# Patient Record
Sex: Male | Born: 1935 | Race: White | Hispanic: No | Smoking: Never smoker
Health system: Southern US, Community
[De-identification: ages and names within clinical notes are randomized; demographics above are authoritative.]

## PROBLEM LIST (undated history)

## (undated) DIAGNOSIS — I480 Paroxysmal atrial fibrillation: Secondary | ICD-10-CM

## (undated) DIAGNOSIS — I639 Cerebral infarction, unspecified: Secondary | ICD-10-CM

## (undated) DIAGNOSIS — M549 Dorsalgia, unspecified: Secondary | ICD-10-CM

## (undated) DIAGNOSIS — G8929 Other chronic pain: Secondary | ICD-10-CM

## (undated) HISTORY — DX: Dorsalgia, unspecified: M54.9

## (undated) HISTORY — DX: Cerebral infarction, unspecified: I63.9

## (undated) HISTORY — DX: Paroxysmal atrial fibrillation: I48.0

## (undated) HISTORY — DX: Other chronic pain: G89.29

---

## 2004-12-16 ENCOUNTER — Ambulatory Visit: Payer: Self-pay | Admitting: Orthopedic Surgery

## 2010-05-15 ENCOUNTER — Ambulatory Visit: Payer: Self-pay

## 2011-02-02 ENCOUNTER — Ambulatory Visit: Payer: Self-pay | Admitting: Orthopedic Surgery

## 2011-02-18 ENCOUNTER — Inpatient Hospital Stay: Payer: Self-pay | Admitting: *Deleted

## 2011-03-10 ENCOUNTER — Ambulatory Visit: Payer: Self-pay | Admitting: Orthopedic Surgery

## 2011-03-31 ENCOUNTER — Ambulatory Visit: Payer: Self-pay | Admitting: Pain Medicine

## 2011-04-14 ENCOUNTER — Ambulatory Visit: Payer: Self-pay | Admitting: Pain Medicine

## 2011-04-28 ENCOUNTER — Ambulatory Visit: Payer: Self-pay | Admitting: Pain Medicine

## 2011-05-03 ENCOUNTER — Inpatient Hospital Stay (HOSPITAL_COMMUNITY)
Admission: EM | Admit: 2011-05-03 | Discharge: 2011-05-06 | DRG: 066 | Disposition: A | Discharge status: Skilled Nursing Facility | Payer: Medicare Other | Attending: Internal Medicine | Admitting: Internal Medicine

## 2011-05-03 ENCOUNTER — Emergency Department (HOSPITAL_COMMUNITY): Payer: Medicare Other

## 2011-05-03 DIAGNOSIS — D696 Thrombocytopenia, unspecified: Secondary | ICD-10-CM | POA: Diagnosis present

## 2011-05-03 DIAGNOSIS — R269 Unspecified abnormalities of gait and mobility: Secondary | ICD-10-CM | POA: Diagnosis present

## 2011-05-03 DIAGNOSIS — I4891 Unspecified atrial fibrillation: Secondary | ICD-10-CM | POA: Diagnosis present

## 2011-05-03 DIAGNOSIS — R4182 Altered mental status, unspecified: Secondary | ICD-10-CM | POA: Diagnosis present

## 2011-05-03 DIAGNOSIS — I6350 Cerebral infarction due to unspecified occlusion or stenosis of unspecified cerebral artery: Principal | ICD-10-CM | POA: Diagnosis present

## 2011-05-03 LAB — POCT I-STAT, CHEM 8
Chloride: 107 mEq/L (ref 96–112)
Creatinine, Ser: 1.3 mg/dL (ref 0.50–1.35)
Glucose, Bld: 104 mg/dL — ABNORMAL HIGH (ref 70–99)
HCT: 49 % (ref 39.0–52.0)
Potassium: 4.2 mEq/L (ref 3.5–5.1)

## 2011-05-04 ENCOUNTER — Inpatient Hospital Stay (HOSPITAL_COMMUNITY): Payer: Medicare Other

## 2011-05-04 ENCOUNTER — Other Ambulatory Visit (HOSPITAL_COMMUNITY): Payer: Medicare Other

## 2011-05-04 ENCOUNTER — Encounter (HOSPITAL_COMMUNITY): Payer: Self-pay

## 2011-05-04 DIAGNOSIS — I6350 Cerebral infarction due to unspecified occlusion or stenosis of unspecified cerebral artery: Secondary | ICD-10-CM

## 2011-05-04 LAB — COMPREHENSIVE METABOLIC PANEL
ALT: 21 U/L (ref 0–53)
AST: 24 U/L (ref 0–37)
Albumin: 3.1 g/dL — ABNORMAL LOW (ref 3.5–5.2)
CO2: 24 mEq/L (ref 19–32)
Calcium: 8.7 mg/dL (ref 8.4–10.5)
Sodium: 137 mEq/L (ref 135–145)
Total Protein: 5.8 g/dL — ABNORMAL LOW (ref 6.0–8.3)

## 2011-05-04 LAB — URINALYSIS, ROUTINE W REFLEX MICROSCOPIC
Bilirubin Urine: NEGATIVE
Hgb urine dipstick: NEGATIVE
Nitrite: NEGATIVE
Specific Gravity, Urine: 1.03 (ref 1.005–1.030)
pH: 5 (ref 5.0–8.0)

## 2011-05-04 LAB — CBC
HCT: 45.5 % (ref 39.0–52.0)
MCH: 28.2 pg (ref 26.0–34.0)
MCHC: 32.8 g/dL (ref 30.0–36.0)
MCV: 85.4 fL (ref 78.0–100.0)
Platelets: 134 10*3/uL — ABNORMAL LOW (ref 150–400)
RBC: 4.76 MIL/uL (ref 4.22–5.81)
RDW: 16.4 % — ABNORMAL HIGH (ref 11.5–15.5)
RDW: 16.4 % — ABNORMAL HIGH (ref 11.5–15.5)
WBC: 8.5 10*3/uL (ref 4.0–10.5)

## 2011-05-04 LAB — LIPID PANEL
Cholesterol: 183 mg/dL (ref 0–200)
HDL: 59 mg/dL (ref >39)
Total CHOL/HDL Ratio: 3.1 RATIO
Triglycerides: 69 mg/dL (ref <150)
VLDL: 14 mg/dL (ref 0–40)

## 2011-05-04 LAB — CK TOTAL AND CKMB (NOT AT ARMC): CK, MB: 6.7 ng/mL (ref 0.3–4.0)

## 2011-05-04 LAB — PROTIME-INR
INR: 1.03 (ref 0.00–1.49)
INR: 1.06 (ref 0.00–1.49)
Prothrombin Time: 13.7 seconds (ref 11.6–15.2)

## 2011-05-04 LAB — DIFFERENTIAL
Eosinophils Relative: 1 % (ref 0–5)
Lymphocytes Relative: 22 % (ref 12–46)
Lymphs Abs: 1.9 10*3/uL (ref 0.7–4.0)
Monocytes Absolute: 0.7 10*3/uL (ref 0.1–1.0)

## 2011-05-04 LAB — TROPONIN I: Troponin I: <0.3 ng/mL (ref <0.30)

## 2011-05-05 LAB — CBC
Hemoglobin: 13.6 g/dL (ref 13.0–17.0)
MCH: 28.7 pg (ref 26.0–34.0)
MCV: 86.1 fL (ref 78.0–100.0)
Platelets: 123 10*3/uL — ABNORMAL LOW (ref 150–400)
RBC: 4.74 MIL/uL (ref 4.22–5.81)
WBC: 5.5 10*3/uL (ref 4.0–10.5)

## 2011-05-05 LAB — PROTIME-INR: Prothrombin Time: 14.9 seconds (ref 11.6–15.2)

## 2011-05-06 LAB — PROTIME-INR
INR: 1.26 (ref 0.00–1.49)
Prothrombin Time: 16.1 seconds — ABNORMAL HIGH (ref 11.6–15.2)

## 2011-05-13 NOTE — Discharge Summary (Addendum)
NAMEARISTIDE, Andrews               ACCOUNT NO.:  [id number]  MEDICAL RECORD NO.:  [id number]  LOCATION:  1405                         FACILITY:  [facility]  PHYSICIAN:  Peggye Pitt, M.D. DATE OF BIRTH:  1934/10/24  DATE OF ADMISSION:  05/03/2011 DATE OF DISCHARGE:  05/06/2011                              DISCHARGE SUMMARY   PRIMARY CARE PROVIDER:  Unassigned at this time.  DISCHARGE DIAGNOSES: 1. Subacute cerebrovascular accident, probably secondary to atrial     fibrillation. 2. Atrial fibrillation. 3. Gait disturbance secondary to subacute cerebrovascular accident. 4. Thrombocytopenia. 5. Altered mental status secondary to subacute cerebrovascular     accident.  DISCHARGE MEDICATIONS: 1. Aspirin 81 mg p.o. daily. 2. Cardizem CD 120 mg p.o. daily. 3. Coumadin 5 mg p.o. daily, INR to be monitored daily by the facility     and Coumadin dose adjusted until INR is therapeutic specifically     between 2 and 3. 4. Zocor 20 mg p.o. daily.  DIAGNOSTIC LABORATORY DATA:  Sodium 137, potassium 4.2, chloride 107, CO2 of 22, BUN 25, creatinine 1.3, glucose 104.  WBC is 8.5, hemoglobin 15.2, hematocrit 45.5, platelets 135, EtOH less than 11.  First set of cardiac enzymes, total CK 344, CK-MB 6.7, troponin I less than 0.30.  PT is 13.7, INR is 1.03, PTT 29.  Lipid profile yields cholesterol of 183, triglycerides 69, HDL 59, LDL 110.  Homocystine 13.3, hemoglobin A1c 5.7.  Urinalysis was negative.  DIAGNOSTIC STUDIES: 1. On admission, chest x-ray yields no acute disease. 2. On admission, CT of the head yields findings compatible with     subacute infarct of the right frontoparietal lobe. 3. On admission, MRI without contrast yields moderate sized     acute/subacute infarct involving the right insular region and     posterior right periauricular region extending to the posterior     frontal - parietal lobe junction. 4. MRA of the head done on admission yields intracranial  atherosclerotic type changes most notable involving the right     middle cerebral artery branches. 5. A 2-D echo done on May 04, 2011, yields an ejection fraction of 60-     65%.  Grade 1 diastolic dysfunction.  No diagnostic regional wall     motion abnormality. 6. Carotid Dopplers yield no significant extracranial carotid artery     stenosis.  Vertebrals are patent with antegrade flow.  CONSULTS:  None.  PROCEDURES:  None.  BRIEF HISTORY:  [name] is a very pleasant 77 year old man who presented to the [facility] ED after being pulled over by the police while driving erratically on the highway.  The patient was apparently coming from [place] to [place] when with highway patrolman spotted him driving erratically.  He was stopped and the police thought him to be intoxicated.  He was tested for alcohol which was negative.  On further questioning, police found that he was to have bruises all over his body and his family was called.  He was also found to be limping and having an abnormal gait, hence he was brought to the emergency room.  In the emergency room, the patient reported having knowledge that he has a history  of an irregular heartbeat and he was at one time on medication to control his heart rate but has not been on that medication for quite a while.  He denied knowing that he had had a stroke.  He denied palpitations, headache, dizziness, fever, cough.  In the ED, he was however found to have subacute stroke and also limping towards the left side.  Hospitalist were asked to admit for further evaluation and treatment.  HOSPITAL COURSE BY PROBLEM: 1. Subacute cerebrovascular accident, probably secondary to untreated     atrial fibrillation.  A 2-D echo with 60-65% grade 1 diastolic     dysfunction.  Carotids were negative for stenosis.  The patient was     evaluated by PT and OT during his hospitalization who recommended     assistance and SNF as the patient  will need 24 x7 care for     cognitive issues..  The patient was started on Zocor right before     discharge as well as aspirin. 2. Atrial fibrillation, currently in sinus rhythm.  At the time of     admission, the patient was started on Cardizem for rate control.     Initially he was given 60 mg q.6 which was decreased to 30 mg q.6     due to bradycardia.  This was transitioned to Cardizem CD 120     daily.  The patient will need followup with his primary care     provider and/or cardiologist in [place] when he leaves the     facility.  The patient also started on Coumadin that was dosed by     pharmacy.  He is currently on 5 mg daily.  This will need to be     monitored on a daily basis until his INR is therapeutic between 2     and 3 and the pharmacy at the facility will need to adjust his     dosage until he is at goal. 3. Gait disturbance secondary to subacute cerebrovascular accident.     The patient participated in physical therapy during his     hospitalization.  Will need to continue daily PT. 4. Altered mental status secondary to subacute cerebrovascular     accident.  The patient demonstrated short-term memory issues as     well as difficulties with focus and problem-solving.  Last evening,     he demonstrated some increased confusion probably second to     sundowner's.  At that morning of discharge, the patient was back at     his baseline.  Will need to continue with rehab at facility. 5. Thrombocytopenia.  Will need outpatient follow-up.  ACTIVITY:  As tolerated per Physical Therapy at facility.  Currently he needs minimal assist with ambulation.  Fall precautions.  DIET:  Regular diet.  FOLLOWUP:  The patient at current time does not have a primary care provider in [place] where he resides.  While he is at facility, that physician to monitor.  At time of discharge from rehab will need assistance establishing primary care provider as well as  cardiology followup.  PHYSICAL EXAMINATION:  VITAL SIGNS:  Temperature 98.0, blood pressure 120/70, heart rate 52, respirations 20, sats 96% on room air. GENERAL:  Awake, alert, no acute distress. CV:  Regular rate and rhythm.  No murmur, gallop or rub.  No lower extremity edema. RESPIRATORY:  Normal effort.  Breath sounds clear to auscultation bilaterally.  No rhonchi, wheezes or rales. NEURO:  Alert and oriented  x2.  Speech clear.  Gait remains unsteady. Short-term memory loss. ABDOMEN:  Round, soft, positive bowel sounds throughout, nontender to palpation.  DISPOSITION:  The patient is medically stable and ready for discharge to facility.  TIME SPENT:  45 minutes.     Gwenyth Bender, NP   ______________________________ Peggye Pitt, M.D.    KMB/MEDQ  D:  05/06/2011  T:  05/06/2011  Job:  [id number]  Electronically Signed by Peggye Pitt M.D. on 05/13/2011 07:52:49 PM Electronically Signed by Toya Smothers  on 05/16/2011 11:00:13 AM

## 2011-05-17 NOTE — H&P (Signed)
[name], [name]               ACCOUNT NO.:  [id number]  MEDICAL RECORD NO.:  [id number]  LOCATION:  WLED                         FACILITY:  [facility]  PHYSICIAN:  Lonia Blood, M.D.      DATE OF BIRTH:  Mar 13, 1934  DATE OF ADMISSION:  05/03/2011 DATE OF DISCHARGE:                             HISTORY & PHYSICAL   PRIMARY CARE PHYSICIAN:  He is unassigned to [place].  PRESENTING COMPLAINT:  Confusion and abnormal gait balance.  HISTORY OF PRESENT ILLNESS:  The patient is a 77 year old gentleman who was brought in by the police after being found driving erratically on the road.  He was apparently coming from [place] to [place] area when the highway patrolman spotted him driving erratically on the road. He was stopped by the police thought to be drunk.  He was tested for alcohol and was negative.  On further questioning by the police, the patient was found to have bruises all over his body and his family were called.  He was found to be limping and having abnormal gait, hence he was brought to the emergency room.  In the ED, the patient reported having knowing that he had irregular heart rate for long time and he was at one time on medication to control the heart rate but has not been on it for a while now.  He also denied having knowing that he had a stroke. No palpitations.  Denied any fever, cough, shortness of breath or chest pain.  He was however found to have subacute stroke among other things and also limping towards the left side.  PAST MEDICAL HISTORY:  His past medical history is significant mainly for chronic back pain and atrial fibrillation which seems to be paroxysmal and recent fall.  ALLERGIES:  No known drug allergies.  MEDICATIONS:  The patient denied any medicines at this point.  SOCIAL HISTORY:  He lives alone in [place].  Denied any tobacco abuse.  No IV drug use.  Occasional alcohol but socially.  FAMILY HISTORY:  Denied any significant family history  including stroke. Mainly hypertension that runs in the family.  REVIEW OF SYMPTOMS:  All systems reviewed are negative except per HPI. The patient had a fall about a week ago in his shower but he was fully conscious.  PHYSICAL EXAMINATION:  VITAL SIGNS:  On his exam, temperature is 98.4, blood pressure initially 153/71 and currently 110/76, his pulse was 135, respiratory rate 20, sats 96% on room air. GENERAL:  The patient is awake, alert, oriented, very pleasant man. Slightly confused but now fully oriented to person, place and time and he has good recall. HEENT:  PERRL.  EOMI.  No significant pallor, no jaundice.  No rhinorrhea. NECK:  Supple.  No visible JVD, no lymphadenopathy. RESPIRATORY:  He has good air entry bilaterally.  No significant wheezes or rales or crackles. CARDIOVASCULAR SYSTEM:  He has irregularly irregular rhythm. ABDOMEN:  His abdomen is soft, full, nontender with positive bowel sounds. EXTREMITIES:  No significant edema, cyanosis or clubbing. SKIN EXAM:  He has multiple bruises and rashes on his left knee, right lower extremity, left upper extremity and right upper extremity respectively, all this from his  recent falls. NEURO EXAM:  Cranial nerves II through XII seem to be fully intact. Power is 5/5 in upper and lower extremities respectively.  Reflexes 2+ bilaterally.  The patient has slightly poor coordination on the left. He walks with a limping gait towards the right side with the left side slightly draggy behind; otherwise no other significant findings.  LABORATORY DATA:  Sodium is 137, potassium 4.2, chloride is 107, BUN 25, creatinine 1.30, glucose 104.  White count is 8.5, hemoglobin 15.2, platelet count 135 with normal differentials.  Alcohol level less than 11.  Initial cardiac enzymes are negative.  Chest x-ray showed no acute disease.  Head CT without contrast showed findings compatible with subacute infarct in the right frontoparietal lobe.  His  EKG showed atrial fibrillation with rapid ventricular response.  Followup EKG showed atrial fibrillation but current rate has been controlled with Cardizem.  ASSESSMENT:  This is a 77 year old gentleman presenting with subacute stroke and gait abnormalities.  The patient seems to have paroxysmal atrial fibrillation.  He is not on any medication at this point for that.  PLAN: 1. Gait disturbance.  We will admit the patient; get PT, OT to     evaluate him.  He lives alone and this is a high risk for him     especially the way he drives on the highway.  After full     evaluation, we will determine whether or not the patient can stay     at home or needs to go to facility or stay with family.  In the     meantime, we will address his underlying cause which is probably     the subacute stroke. 2. Subacute cerebrovascular accident.  We will work him up, get     carotid Dopplers; MRI, MRA of the brain and also 2-D     echocardiogram.  More than likely this came from his atrial     fibrillation.  I will start him also aspirin but also at this point     the patient needs to be on some anticoagulation.  We will check     fasting lipid panel as well and then consider starting on statin. 3. Atrial fibrillation.  Again per patient he knew he had history of     that but apparently has not been on any treatment.  I will put him     some Cardizem for rate control and also start him on Coumadin but     no Lovenox or any bridging due to the subacute stroke.  We will get     cardiology consult either locally or make referred to cardiologist     at [facility] for further treatment. 4. Status post fall.  It might be related to his current stroke.  In     maybe when he had a stroke, although the patient denied it.  We     will get the PT, OT to evaluate him fully and make recommendations. 5. Thrombocytopenia.  The patient denied heavy alcohol intake and I     will take him by his words.  The cause of  thrombocytopenia is not     entirely known at this point.  We will follow it closely and if     this persists, will do further workup.     Lonia Blood, M.D.     Verlin Grills  D:  05/04/2011  T:  05/04/2011  Job:  [id number]  Electronically Signed by Lonia Blood M.D. on  05/17/2011 12:34:50 AM

## 2012-07-21 ENCOUNTER — Emergency Department: Payer: Self-pay | Admitting: Emergency Medicine

## 2012-07-21 LAB — CBC
MCH: 28.5 pg (ref 26.0–34.0)
MCHC: 32.9 g/dL (ref 32.0–36.0)
MCV: 87 fL (ref 80–100)
Platelet: 155 10*3/uL (ref 150–440)
RDW: 15.7 % — ABNORMAL HIGH (ref 11.5–14.5)

## 2012-07-21 LAB — COMPREHENSIVE METABOLIC PANEL
Albumin: 3.8 g/dL (ref 3.4–5.0)
Alkaline Phosphatase: 73 U/L (ref 50–136)
Bilirubin,Total: 0.5 mg/dL (ref 0.2–1.0)
Co2: 22 mmol/L (ref 21–32)
Creatinine: 1.16 mg/dL (ref 0.60–1.30)
Glucose: 138 mg/dL — ABNORMAL HIGH (ref 65–99)
Osmolality: 278 (ref 275–301)
Sodium: 138 mmol/L (ref 136–145)
Total Protein: 7.1 g/dL (ref 6.4–8.2)

## 2012-07-21 LAB — TROPONIN I: Troponin-I: <0.02 ng/mL

## 2012-08-13 IMAGING — CR DG CHEST 1V PORT
1 series · 1 of 1 positions shown · non-contrast
Comparison: none

REASON FOR EXAM: Chest Pain
COMMENTS:

PROCEDURE:     DXR - DXR PORTABLE CHEST SINGLE VIEW  - February 17, 2011  [DATE]
RESULT:     Comparison: None.

[view not recorded]
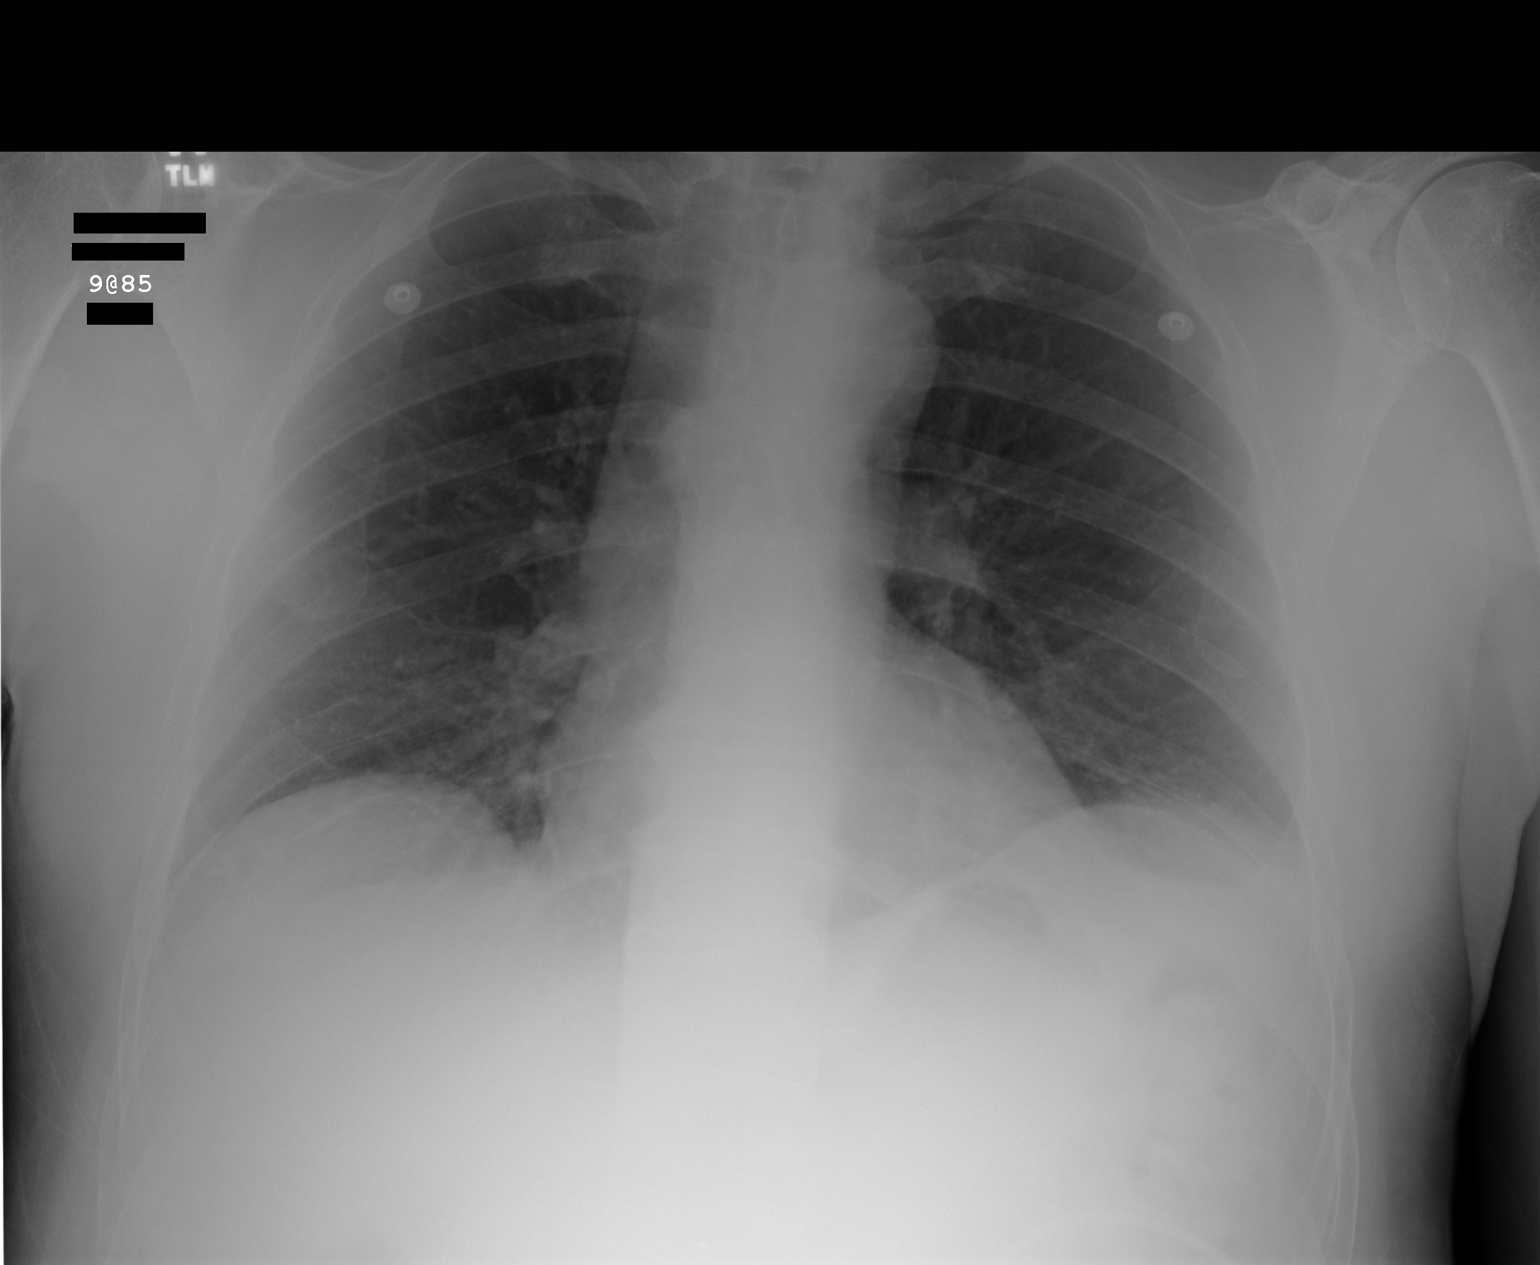

[1 of 1 positions shown; findings below may reference images not displayed]

FINDINGS: Heart is normal in size. Mild prominence of the right the mediastinum is
felt to be secondary to patient rotation. There are mild left basilar
opacities that likely represent atelectasis.
IMPRESSION: Mild left basilar opacities likely represent atelectasis. Early infection
not excluded.

Followup PA and lateral chest radiographs are recommended.

## 2012-08-14 ENCOUNTER — Emergency Department: Payer: Self-pay | Admitting: Emergency Medicine

## 2012-08-14 LAB — BASIC METABOLIC PANEL
Anion Gap: 11 (ref 7–16)
BUN: 23 mg/dL — ABNORMAL HIGH (ref 7–18)
Chloride: 104 mmol/L (ref 98–107)
Creatinine: 1.26 mg/dL (ref 0.60–1.30)
Sodium: 139 mmol/L (ref 136–145)

## 2012-08-14 LAB — CBC
HCT: 39.6 % — ABNORMAL LOW (ref 40.0–52.0)
HGB: 13.5 g/dL (ref 13.0–18.0)
MCH: 28.9 pg (ref 26.0–34.0)
MCV: 85 fL (ref 80–100)
Platelet: 231 10*3/uL (ref 150–440)
RBC: 4.66 10*6/uL (ref 4.40–5.90)
WBC: 9.4 10*3/uL (ref 3.8–10.6)

## 2012-08-14 LAB — URINALYSIS, COMPLETE
Glucose,UR: 50 mg/dL (ref 0–75)
Leukocyte Esterase: NEGATIVE
Nitrite: POSITIVE
Protein: 100
RBC,UR: 9289 /HPF (ref 0–5)
Squamous Epithelial: NONE SEEN
WBC UR: 56 /HPF (ref 0–5)

## 2012-09-01 ENCOUNTER — Ambulatory Visit: Payer: Self-pay | Admitting: Urology

## 2013-05-08 ENCOUNTER — Observation Stay: Payer: Self-pay | Admitting: Internal Medicine

## 2013-05-08 LAB — CBC
HCT: 47.5 % (ref 40.0–52.0)
HGB: 15.8 g/dL (ref 13.0–18.0)
MCH: 27.3 pg (ref 26.0–34.0)
RBC: 5.77 10*6/uL (ref 4.40–5.90)
RDW: 16.6 % — ABNORMAL HIGH (ref 11.5–14.5)

## 2013-05-08 LAB — COMPREHENSIVE METABOLIC PANEL
Albumin: 3.7 g/dL (ref 3.4–5.0)
Alkaline Phosphatase: 74 U/L (ref 50–136)
Anion Gap: 10 (ref 7–16)
Co2: 24 mmol/L (ref 21–32)
Creatinine: 1.24 mg/dL (ref 0.60–1.30)
Glucose: 138 mg/dL — ABNORMAL HIGH (ref 65–99)
SGPT (ALT): 32 U/L (ref 12–78)
Total Protein: 7.1 g/dL (ref 6.4–8.2)

## 2013-05-08 LAB — PROTIME-INR: INR: 0.9

## 2013-05-08 LAB — CK TOTAL AND CKMB (NOT AT ARMC): CK-MB: 5.8 ng/mL — ABNORMAL HIGH (ref 0.5–3.6)

## 2013-05-09 LAB — CBC WITH DIFFERENTIAL/PLATELET
Eosinophil #: 0.2 10*3/uL (ref 0.0–0.7)
Eosinophil %: 3.9 %
MCH: 27.5 pg (ref 26.0–34.0)
MCV: 82 fL (ref 80–100)
Monocyte #: 0.5 x10 3/mm (ref 0.2–1.0)
Neutrophil #: 2.8 10*3/uL (ref 1.4–6.5)
RBC: 5.01 10*6/uL (ref 4.40–5.90)
RDW: 16.8 % — ABNORMAL HIGH (ref 11.5–14.5)

## 2013-05-09 LAB — BASIC METABOLIC PANEL
Anion Gap: 7 (ref 7–16)
BUN: 13 mg/dL (ref 7–18)
Chloride: 108 mmol/L — ABNORMAL HIGH (ref 98–107)
EGFR (African American): >60
EGFR (Non-African Amer.): >60
Osmolality: 283 (ref 275–301)

## 2013-05-09 LAB — T4, FREE: Free Thyroxine: 1.09 ng/dL (ref 0.76–1.46)

## 2013-05-10 LAB — LIPID PANEL
HDL Cholesterol: 39 mg/dL — ABNORMAL LOW (ref 40–60)
Triglycerides: 152 mg/dL (ref 0–200)
VLDL Cholesterol, Calc: 30 mg/dL (ref 5–40)

## 2014-05-31 ENCOUNTER — Encounter: Payer: Self-pay | Admitting: *Deleted

## 2015-03-08 NOTE — H&P (Signed)
PATIENT NAME:  Phineas DouglasHUGHES, Kermitt A MR#:  [id number] DATE OF BIRTH:  August 22, 1934  PRIMARY CARE PHYSICIAN:  The patient does not remember.    REFERRING PHYSICIAN: Dr. Mayford KnifeWilliams   CHIEF COMPLAINT: Palpitation and irregular heart today.   HISTORY OF PRESENT ILLNESS: The patient is a 81 year old Caucasian male with a history of paroxysmal atrial fibrillation, cerebrovascular accident one year ago. He presented in the ED with palpitation and irregular heart rate today. The patient denies any chest pain, orthopnea or nocturnal dyspnea. No leg edema. No cough, sputum, shortness of breath or hemoptysis. The patient's heart rate was to 138. EKG showed atrial fibrillation. The  patient is not on any home medications.   PAST MEDICAL HISTORY:  Paroxysmal atrial fibrillation, CVA.   SURGICAL HISTORY:  Arm surgery for broken bone.   FAMILY HISTORY: No hypertension, diabetes, heart attack or stroke.   SOCIAL HISTORY: No smoking or drinking or illicit drugs.   ALLERGIES: None.   MEDICATIONS:  None.   REVIEW OF SYSTEMS:  CONSTITUTIONAL: The patient denies any fever or chills. No headache or dizziness. No weakness.  EYES: No double vision, blurry vision.  ENT: No postnasal drip, slurred speech or dysphagia.  CARDIOVASCULAR: No chest pain, but has palpitations. No orthopnea or nocturnal dyspnea. No leg edema.  PULMONARY: No cough, sputum, shortness of breath or hemoptysis.  GASTROINTESTINAL: No abdominal pain, nausea, vomiting or diarrhea.  GENITOURINARY: No dysuria, hematuria or incontinence.   SKIN: No rash or jaundice.  HEMATOLOGY: No easy bruising or bleeding.  ENDOCRINOLOGY: No polyuria, polydipsia, heat or cold intolerance.  NEUROLOGY: No syncope, loss of consciousness or seizure.   PHYSICAL EXAMINATION: VITAL SIGNS: Temperature 97.5, blood pressure 113/67, pulse 114, respirations 20, oxygen saturation 96% on room air.  GENERAL: The patient is alert, awake, oriented, in no acute distress.  HEENT:  Pupils round, equal and reactive to light and accommodation. Moist oral mucosa. Clear oropharynx.  NECK: Supple. No JVD or carotid bruits are noted. No thyromegaly.  CARDIOVASCULAR: S1, S3 irregular rate, rhythm. No murmurs, gallops.  PULMONARY: Bilateral air entry. No wheezing or rales. No use of accessory muscles to breathe.  ABDOMEN: Soft. No distention or tenderness. No organomegaly. Bowel sounds present.  EXTREMITIES: No edema, clubbing or cyanosis. No calf tenderness. Strong bilateral pedal pulses.  SKIN: No rash or jaundice.  NEUROLOGIC: A and O  x 3. No focal deficit. Power 5/5. Sensation intact.   LABORATORY DATA:  Chest x-ray: No acute disease of the chest.   Troponin less than 0.02. CK 257, CK-MB 5.8, INR 0.9. CBC normal. Glucose 138, BUN 16, creatinine 1.24.   Electrolytes were normal.   EKG showed atrial fibrillation with a rapid ventricular response at 126 BPM.   IMPRESSIONS: 1.  Atrial fibrillation with rapid ventricular response.  2.  History of cerebrovascular accident.   PLAN OF TREATMENT:   1.  The patient will be placed for observation. We will continue telemonitor, we will start Cardizem 30 mg every 6 hours and start Lovenox  every 12 hours subcutaneous 1 mg/kg.  2.  We will get an echocardiogram and cardiology consult from Dr. Lady GaryFath, who is the patient's cardiologist. Discussed the patient's current condition and plan of treatment with the patient.   CODE STATUS:  The patient is a full code.   TIME SPENT: About 50 minutes    ____________________________ Shaune PollackQing Maris Bena, MD qc:cc D: 05/08/2013 22:10:41 ET T: 05/08/2013 22:50:20 ET JOB#: [id number]  cc: Shaune PollackQing Diarra Kos, MD, <Dictator> Shaune PollackQING Glorianna Gott MD ELECTRONICALLY  SIGNED 05/09/2013 15:26

## 2015-03-08 NOTE — Consult Note (Signed)
Chief Complaint:  Subjective/Chief Complaint States to feel well but appearsto still be confused. Denies cp nofocal weakness.   VITAL SIGNS/ANCILLARY NOTES: **Vital Signs.:   25-Jun-14 11:03  Vital Signs Type Q 4hr  Temperature Temperature (F) 97.5  Celsius 36.3  Temperature Source oral  Pulse Pulse 63  Respirations Respirations 18  Systolic BP Systolic BP 628  Diastolic BP (mmHg) Diastolic BP (mmHg) 81  Mean BP 98  Pulse Ox % Pulse Ox % 96  Pulse Ox Activity Level  At rest  Oxygen Delivery Room Air/ 21 %  *Intake and Output.:   25-Jun-14 12:15  Grand Totals Intake:  240 Output:      Net:  240 24 Hr.:  240  Oral Intake      In:  240  Percentage of Meal Eaten  100   Brief Assessment:  GEN well developed, well nourished, no acute distress   Cardiac Regular  murmur present  -- LE edema  -- JVD  --Gallop   Respiratory normal resp effort  clear BS  no use of accessory muscles   Gastrointestinal Normal   Gastrointestinal details normal Soft  Nontender  Nondistended  No masses palpable   EXTR negative cyanosis/clubbing, negative edema   Lab Results: Thyroid:  24-Jun-14 05:02   Thyroxine, Free 1.09 (Result(s) reported on 09 May 2013 at 11:13AM.)  Thyroid Stimulating Hormone  4.96 (0.45-4.50 (International Unit)  ----------------------- Pregnant patients have  different reference  ranges for TSH:  - - - - - - - - - -  Pregnant, first trimetser:  0.36 - 2.50 uIU/mL)  LabObservation:  24-Jun-14 13:45   OBSERVATION Reason for Test  Cardiology:  24-Jun-14 13:45   Echo Doppler REASON FOR EXAM:     COMMENTS:     PROCEDURE: [facility] - ECHO DOPPLER COMPLETE(TRANSTHOR)  - May 09 2013  1:45PM   RESULT: Echocardiogram Report  Patient Name:   [name] Date of Exam: 05/09/2013 Medical Rec #:  [id number]             Custom1: Date of Birth:  [birth year]          Height:       73.0 in Patient Age:    81 years           Weight:       246.0 lb Patient Gender: M                   BSA:          2.35 m??  Indications: Atrial Fib Sonographer:    Sherrie Sport RDCS Referring Phys: Lynett Fish, B  Summary:  1. Left ventricular ejection fraction, by visual estimation, is 60 to  65%.  2. Normal global left ventricular systolic function.  3. Mild thickening of the anterior and posterior mitral valve leaflets.  4. Mild aortic valve sclerosis without stenosis.  5. Mildly increased left ventricular posterior wall thickness.  6. Mild tricuspid regurgitation. 2D AND M-MODE MEASUREMENTS (normal ranges within parentheses): Left Ventricle:          Normal IVSd (2D):     1.24 cm (0.7-1.1) LVPWd (2D):     1.21 cm (0.7-1.1) Aorta/LA:                  Normal LVIDd (2D):     4.39 cm (3.4-5.7) Aortic Root (2D): 3.10 cm (2.4-3.7) LVIDs (2D):     2.82 cm  Left Atrium (2D): 3.10 cm (1.9-4.0) LV FS (2D):35.8 %   (>25%) LV EF (2D):     65.5 %   (>50%)                                   Right Ventricle:                                   RVd (2D):        6.73 cm LV DIASTOLIC FUNCTION: MV Peak E: 0.78 m/s E/e' Ratio: 7.60 MV Peak A: 0.61 m/s Decel Time: 246 msec E/A Ratio: 1.27 SPECTRAL DOPPLER ANALYSIS (where applicable): Mitral Valve: MV P1/2 Time: 71.34 msec MV Area, PHT: 3.08 cm?? Aortic Valve: AoV Max Vel: 1.00 m/s AoV Peak PG: 4.0 mmHg AoV Mean PG: LVOT Vmax: 0.81 m/s LVOT VTI:  LVOT Diameter: 2.20 cm AoV Area, Vmax: 3.08 cm?? AoV Area, VTI:  AoV Area, Vmn: Tricuspid Valve and PA/RV Systolic Pressure: TR Max Velocity: 1.45 m/s RA  Pressure: 5 mmHg RVSP/PASP: 13.4 mmHg Pulmonic Valve: PV Max Velocity: 0.84 m/s PV Max PG: 2.8 mmHg PV Mean PG:  PHYSICIAN INTERPRETATION: Left Ventricle: The left ventricular internal cavity size was normal. LV  septal wall thickness was normal. LV posterior wall thickness was mildly  increased. Global LV systolic function was normal. Left ventricular  ejection fraction, by visual estimation, is 60 to 65%. Right Ventricle: The  right ventricular size is normal. Left Atrium: The left atrium is normal in size. Right Atrium: The right atrium is normal in size. Pericardium: There is no evidence of pericardial effusion. Mitral Valve: The mitral valve is normal in structure. There is mild  thickening of the anterior and posterior mitral valve leaflets. Trace  mitral valve regurgitation is seen. Tricuspid Valve: The tricuspid valve is normal. Mild tricuspid  regurgitation is visualized. The tricuspid regurgitant velocity is 1.45  m/s, and with an assumed right atrial pressure of 5 mmHg, the estimated  right ventricular systolic pressure is normal at 13.4 mmHg. Aortic Valve: The aortic valve is normal. Mild aortic valve sclerosis is  present, with no evidence of aortic valve stenosis. Pulmonic Valve: The pulmonic valve is normal.  Lisle MD Electronically signed by [place] MD Signature Date/Time: 05/09/2013/5:19:00 PM *** Final ***  IMPRESSION: .    Verified By: Yolonda Kida, M.D., MD  Routine Chem:  24-Jun-14 05:02   Cholesterol, Serum 174  Triglycerides, Serum 152  HDL (INHOUSE)  39  VLDL Cholesterol Calculated 30  LDL Cholesterol Calculated  105 (Result(s) reported on 10 May 2013 at 08:16AM.)  Glucose, Serum 98  BUN 13  Creatinine (comp) 1.13  Sodium, Serum 142  Potassium, Serum 4.2  Chloride, Serum  108  CO2, Serum 27  Calcium (Total), Serum  8.3  Anion Gap 7  Osmolality (calc) 283  eGFR (African American) >60  eGFR (Non-African American) >60 (eGFR values <13m/min/1.73 m2 may be an indication of chronic kidney disease (CKD). Calculated eGFR is useful in patients with stable renal function. The eGFR calculation will not be reliable in acutely ill patients when serum creatinine is changing rapidly. It is not useful in  patients on dialysis. The eGFR calculation may not be applicable to patients at the low and high extremes of body sizes, pregnant women, and  vegetarians.)  Magnesium, Serum 1.8 (1.8-2.4 THERAPEUTIC  RANGE: 4-7 mg/dL TOXIC: > 10 mg/dL  -----------------------)  Routine Hem:  24-Jun-14 05:02   WBC (CBC) 5.7  RBC (CBC) 5.01  Hemoglobin (CBC) 13.8  Hematocrit (CBC) 41.1  Platelet Count (CBC)  142  MCV 82  MCH 27.5  MCHC 33.5  RDW  16.8  Neutrophil % 48.4  Lymphocyte % 38.3  Monocyte % 8.6  Eosinophil % 3.9  Basophil % 0.8  Neutrophil # 2.8  Lymphocyte # 2.2  Monocyte # 0.5  Eosinophil # 0.2  Basophil # 0.0 (Result(s) reported on 09 May 2013 at 05:26AM.)   Radiology Results: XRay:    23-Jun-14 20:07, Chest Portable Single View  Chest Portable Single View   REASON FOR EXAM:    Chest Pain  COMMENTS:       PROCEDURE: DXR - DXR PORTABLE CHEST SINGLE VIEW  - May 08 2013  8:07PM     RESULT: Comparison: None    Findings:     Single portable AP chest radiograph is provided.  There is no focal   parenchymal opacity, pleural effusion, or pneumothorax. Normal   cardiomediastinal silhouette. The osseous structures are unremarkable.    IMPRESSION:   No acute disease of the chest.    Dictation Site: 1        Verified By: Jennette Banker, M.D., MD  MRI:    25-Jun-14 10:13, MRI Brain Without Contrast  MRI Brain Without Contrast   REASON FOR EXAM:    cva  COMMENTS:       PROCEDURE: MR  - MR BRAIN WO CONTRAST  - May 10 2013 10:13AM     RESULT: History: CVA    Technique: Multiplanar, multisequence MRI of the brain was obtained   without IV contrast.     Comparison:  None    Findings:     There is no acute infarct. 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 There is no hemorrhage. There is no pathologic   extra-axial fluid collection. There is generalized cerebral atrophy.   There is periventricular and deep white matter T2 and FLAIR   hyperintensity likely secondary to microangiopathy. There is no   hydrocephalus. The ventricles are normal for age. The basal cisterns are    patent. There are no abnormal extra-axial fluid collections.     The visualized paranasal sinuses and mastoid sinuses are clear. The skull   base and calvarium demonstrate normal signal. The major intracranial flow   voids, including dural venous sinuses appear patent.    IMPRESSION:     No acute intracranial pathology.  Dictation Site: 1        Verified By: Jennette Banker, M.D., MD  Cardiology:    23-Jun-14 19:36, ECG  Ventricular Rate 126  Atrial Rate 159  QRS Duration 82  QT 298  QTc 431  R Axis 22  T Axis 54  ECG interpretation   Atrial fibrillation with rapid ventricular response  Nonspecific ST abnormality , probably digitalis effect  Abnormal ECG  When compared with ECG of 21-Jul-2012 20:10,  No significant change was found  ----------unconfirmed----------  Confirmed by OVERREAD, NOT ([id number]), editor PEARSON, BARBARA ([id number]) on 05/10/2013 8:18:52 AM  ECG     23-Jun-14 20:41, ECG  Ventricular Rate 92  Atrial Rate 113  QRS Duration 90  QT 350  QTc 432  R Axis 11  T Axis 51  ECG interpretation   Atrial fibrillation  Abnormal ECG  When compared with ECG  of 08-May-2013 19:36,  No significant change was found  ----------unconfirmed----------  Confirmed by OVERREAD, NOT ([id number]), editor PEARSON, BARBARA ([id number]) on 05/10/2013 8:21:11 AM  ECG     24-Jun-14 13:45, Echo Doppler  Echo Doppler   REASON FOR EXAM:      COMMENTS:       PROCEDURE: [facility] - ECHO DOPPLER COMPLETE(TRANSTHOR)  - May 09 2013  1:45PM     RESULT: Echocardiogram Report    Patient Name:   [name] Date of Exam: 05/09/2013  Medical Rec #:  [id number]             Custom1:  Date of Birth:  10-29-1934          Height:       73.0 in  Patient Age:    9 years           Weight:       246.0 lb  Patient Gender: M                  BSA:          2.35 m??    Indications: Atrial Fib  Sonographer:    Sherrie Sport RDCS  Referring Phys: Lynett Fish, B    Summary:   1. Left ventricular ejection fraction, by  visual estimation, is 60 to   65%.   2. Normal global left ventricular systolic function.   3. Mild thickening of the anterior and posterior mitral valve leaflets.   4. Mild aortic valve sclerosis without stenosis.   5. Mildly increased left ventricular posterior wall thickness.   6. Mild tricuspid regurgitation.  2D AND M-MODE MEASUREMENTS (normal ranges within parentheses):  Left Ventricle:          Normal  IVSd (2D):     1.24 cm (0.7-1.1)  LVPWd (2D):     1.21 cm (0.7-1.1) Aorta/LA:                  Normal  LVIDd (2D):     4.39 cm (3.4-5.7) Aortic Root (2D): 3.10 cm (2.4-3.7)  LVIDs (2D):     2.82 cm           Left Atrium (2D): 3.10 cm (1.9-4.0)  LV FS (2D):35.8 %   (>25%)  LV EF (2D):     65.5 %   (>50%)                                    Right Ventricle:                                    RVd (2D):        6.27 cm  LV DIASTOLIC FUNCTION:  MV Peak E: 0.78 m/s E/e' Ratio: 7.60  MV Peak A: 0.61 m/s Decel Time: 246 msec  E/A Ratio: 1.27  SPECTRAL DOPPLER ANALYSIS (where applicable):  Mitral Valve:  MV P1/2 Time: 71.34 msec  MV Area, PHT: 3.08 cm??  Aortic Valve: AoV Max Vel: 1.00 m/s AoV Peak PG: 4.0 mmHg AoV Mean PG:  LVOT Vmax: 0.81 m/s LVOT VTI:  LVOT Diameter: 2.20 cm  AoV Area, Vmax: 3.08 cm?? AoV Area, VTI:  AoV Area, Vmn:  Tricuspid Valve and PA/RV Systolic Pressure: TR Max Velocity: 1.45 m/s RA   Pressure: 5 mmHg RVSP/PASP: 13.4 mmHg  Pulmonic  Valve:  PV Max Velocity: 0.84 m/s PV Max PG: 2.8 mmHg PV Mean PG:    PHYSICIAN INTERPRETATION:  Left Ventricle: The left ventricular internal cavity size was normal. LV   septal wall thickness was normal. LV posterior wall thickness was mildly   increased. Global LV systolic function was normal. Left ventricular   ejection fraction, by visual estimation, is 60 to 65%.  Right Ventricle: The right ventricular size is normal.  Left Atrium: The left atrium is normal in size.  Right Atrium: The right atrium is normal in  size.  Pericardium: There is no evidence of pericardial effusion.  Mitral Valve: The mitral valve is normal in structure. There is mild   thickening of the anterior and posterior mitral valve leaflets. Trace   mitral valve regurgitation is seen.  Tricuspid Valve: The tricuspid valve is normal. Mild tricuspid   regurgitation is visualized. The tricuspid regurgitant velocity is 1.45   m/s, and with an assumed right atrial pressure of 5 mmHg, the estimated   right ventricular systolic pressure is normal at 13.4 mmHg.  Aortic Valve: The aortic valve is normal. Mild aortic valve sclerosis is   present, with no evidence of aortic valve stenosis.  Pulmonic Valve: The pulmonic valve is normal.    Henderson MD  Electronically signed by [place] MD  Signature Date/Time: 05/09/2013/5:19:00 PM  *** Final ***    IMPRESSION: .        Verified By: Yolonda Kida, M.D., MD   Assessment/Plan:  Invasive Device Daily Assessment of Necessity:  Does the patient currently have any of the following indwelling devices? none   Assessment/Plan:  Assessment IMP AFIB HxCVA Dementia Confusion HTN Hyperliidemia Non compliance .   Plan PLAN ASA 81 mg daily Tele  Rec xarelto 20 mg daily Rate controlwith diltiazemdaily Consider rhythm control with Amiodarone Statin therapy for lipids Care management consult Psychiatry involment for management input   Electronic Signatures: Yolonda Kida (MD)  (Signed 25-Jun-14 21:10)  Authored: Chief Complaint, VITAL SIGNS/ANCILLARY NOTES, Brief Assessment, Lab Results, Radiology Results, Assessment/Plan   Last Updated: 25-Jun-14 21:10 by Lujean Amel D (MD)

## 2015-03-08 NOTE — Consult Note (Signed)
Chief Complaint:  Subjective/Chief Complaint He states to be doing well and wants to go home. He still appears a little confused.   VITAL SIGNS/ANCILLARY NOTES: **Vital Signs.:   26-Jun-14 07:12  Vital Signs Type Routine  Temperature Temperature (F) 98.5  Celsius 36.9  Temperature Source oral  Pulse Pulse 54  Respirations Respirations 18  Systolic BP Systolic BP 96  Diastolic BP (mmHg) Diastolic BP (mmHg) 49  Mean BP 64  Pulse Ox % Pulse Ox % 96  Pulse Ox Activity Level  At rest  Oxygen Delivery Room Air/ 21 %  *Intake and Output.:   26-Jun-14 08:41  Grand Totals Intake:  240 Output:      Net:  240 24 Hr.:  240  Oral Intake      In:  240  Percentage of Meal Eaten  100   Brief Assessment:  GEN well developed, well nourished, no acute distress   Cardiac Regular  murmur present  -- LE edema  -- JVD  --Gallop   Respiratory normal resp effort  clear BS  no use of accessory muscles   Gastrointestinal Normal   Gastrointestinal details normal Soft  Nontender  Nondistended  No masses palpable   EXTR negative cyanosis/clubbing, negative edema   Lab Results: Thyroid:  24-Jun-14 05:02   Thyroxine, Free 1.09 (Result(s) reported on 09 May 2013 at 11:13AM.)  Thyroid Stimulating Hormone  4.96 (0.45-4.50 (International Unit)  ----------------------- Pregnant patients have  different reference  ranges for TSH:  - - - - - - - - - -  Pregnant, first trimetser:  0.36 - 2.50 uIU/mL)  LabObservation:  24-Jun-14 13:45   OBSERVATION Reason for Test  Cardiology:  24-Jun-14 13:45   Echo Doppler REASON FOR EXAM:     COMMENTS:     PROCEDURE: [facility] - ECHO DOPPLER COMPLETE(TRANSTHOR)  - May 09 2013  1:45PM   RESULT: Echocardiogram Report  Patient Name:   [name] Date of Exam: 05/09/2013 Medical Rec #:  [id number]             Custom1: Date of Birth:  1934-02-17          Height:       73.0 in Patient Age:    81 years           Weight:       246.0 lb Patient Gender: M                   BSA:          2.35 m??  Indications: Atrial Fib Sonographer:    Sherrie Sport RDCS Referring Phys: Lynett Fish, B  Summary:  1. Left ventricular ejection fraction, by visual estimation, is 60 to  65%.  2. Normal global left ventricular systolic function.  3. Mild thickening of the anterior and posterior mitral valve leaflets.  4. Mild aortic valve sclerosis without stenosis.  5. Mildly increased left ventricular posterior wall thickness.  6. Mild tricuspid regurgitation. 2D AND M-MODE MEASUREMENTS (normal ranges within parentheses): Left Ventricle:          Normal IVSd (2D):     1.24 cm (0.7-1.1) LVPWd (2D):     1.21 cm (0.7-1.1) Aorta/LA:                  Normal LVIDd (2D):     4.39 cm (3.4-5.7) Aortic Root (2D): 3.10 cm (2.4-3.7) LVIDs (2D):     2.82 cm  Left Atrium (2D): 3.10 cm (1.9-4.0) LV FS (2D):35.8 %   (>25%) LV EF (2D):     65.5 %   (>50%)                                   Right Ventricle:                                   RVd (2D):        1.61 cm LV DIASTOLIC FUNCTION: MV Peak E: 0.78 m/s E/e' Ratio: 7.60 MV Peak A: 0.61 m/s Decel Time: 246 msec E/A Ratio: 1.27 SPECTRAL DOPPLER ANALYSIS (where applicable): Mitral Valve: MV P1/2 Time: 71.34 msec MV Area, PHT: 3.08 cm?? Aortic Valve: AoV Max Vel: 1.00 m/s AoV Peak PG: 4.0 mmHg AoV Mean PG: LVOT Vmax: 0.81 m/s LVOT VTI:  LVOT Diameter: 2.20 cm AoV Area, Vmax: 3.08 cm?? AoV Area, VTI:  AoV Area, Vmn: Tricuspid Valve and PA/RV Systolic Pressure: TR Max Velocity: 1.45 m/s RA  Pressure: 5 mmHg RVSP/PASP: 13.4 mmHg Pulmonic Valve: PV Max Velocity: 0.84 m/s PV Max PG: 2.8 mmHg PV Mean PG:  PHYSICIAN INTERPRETATION: Left Ventricle: The left ventricular internal cavity size was normal. LV  septal wall thickness was normal. LV posterior wall thickness was mildly  increased. Global LV systolic function was normal. Left ventricular  ejection fraction, by visual estimation, is 60 to 65%. Right Ventricle:  The right ventricular size is normal. Left Atrium: The left atrium is normal in size. Right Atrium: The right atrium is normal in size. Pericardium: There is no evidence of pericardial effusion. Mitral Valve: The mitral valve is normal in structure. There is mild  thickening of the anterior and posterior mitral valve leaflets. Trace  mitral valve regurgitation is seen. Tricuspid Valve: The tricuspid valve is normal. Mild tricuspid  regurgitation is visualized. The tricuspid regurgitant velocity is 1.45  m/s, and with an assumed right atrial pressure of 5 mmHg, the estimated  right ventricular systolic pressure is normal at 13.4 mmHg. Aortic Valve: The aortic valve is normal. Mild aortic valve sclerosis is  present, with no evidence of aortic valve stenosis. Pulmonic Valve: The pulmonic valve is normal.  Roy MD Electronically signed by [place] MD Signature Date/Time: 05/09/2013/5:19:00 PM *** Final ***  IMPRESSION: .    Verified By: Yolonda Kida, M.D., MD  Routine Chem:  24-Jun-14 05:02   Cholesterol, Serum 174  Triglycerides, Serum 152  HDL (INHOUSE)  39  VLDL Cholesterol Calculated 30  LDL Cholesterol Calculated  105 (Result(s) reported on 10 May 2013 at 08:16AM.)  Glucose, Serum 98  BUN 13  Creatinine (comp) 1.13  Sodium, Serum 142  Potassium, Serum 4.2  Chloride, Serum  108  CO2, Serum 27  Calcium (Total), Serum  8.3  Anion Gap 7  Osmolality (calc) 283  eGFR (African American) >60  eGFR (Non-African American) >60 (eGFR values <39m/min/1.73 m2 may be an indication of chronic kidney disease (CKD). Calculated eGFR is useful in patients with stable renal function. The eGFR calculation will not be reliable in acutely ill patients when serum creatinine is changing rapidly. It is not useful in  patients on dialysis. The eGFR calculation may not be applicable to patients at the low and high extremes of body sizes, pregnant women, and  vegetarians.)  Magnesium, Serum 1.8 (1.8-2.4 THERAPEUTIC  RANGE: 4-7 mg/dL TOXIC: > 10 mg/dL  -----------------------)  Routine Hem:  24-Jun-14 05:02   WBC (CBC) 5.7  RBC (CBC) 5.01  Hemoglobin (CBC) 13.8  Hematocrit (CBC) 41.1  Platelet Count (CBC)  142  MCV 82  MCH 27.5  MCHC 33.5  RDW  16.8  Neutrophil % 48.4  Lymphocyte % 38.3  Monocyte % 8.6  Eosinophil % 3.9  Basophil % 0.8  Neutrophil # 2.8  Lymphocyte # 2.2  Monocyte # 0.5  Eosinophil # 0.2  Basophil # 0.0 (Result(s) reported on 09 May 2013 at 05:26AM.)   Radiology Results: XRay:    23-Jun-14 20:07, Chest Portable Single View  Chest Portable Single View   REASON FOR EXAM:    Chest Pain  COMMENTS:       PROCEDURE: DXR - DXR PORTABLE CHEST SINGLE VIEW  - May 08 2013  8:07PM     RESULT: Comparison: None    Findings:     Single portable AP chest radiograph is provided.  There is no focal   parenchymal opacity, pleural effusion, or pneumothorax. Normal   cardiomediastinal silhouette. The osseous structures are unremarkable.    IMPRESSION:   No acute disease of the chest.    Dictation Site: 1        Verified By: Jennette Banker, M.D., MD  MRI:    25-Jun-14 10:13, MRI Brain Without Contrast  MRI Brain Without Contrast   REASON FOR EXAM:    cva  COMMENTS:       PROCEDURE: MR  - MR BRAIN WO CONTRAST  - May 10 2013 10:13AM     RESULT: History: CVA    Technique: Multiplanar, multisequence MRI of the brain was obtained   without IV contrast.     Comparison:  None    Findings:     There is no acute infarct. 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 There is no hemorrhage. There is no pathologic   extra-axial fluid collection. There is generalized cerebral atrophy.   There is periventricular and deep white matter T2 and FLAIR   hyperintensity likely secondary to microangiopathy. There is no   hydrocephalus. The ventricles are normal for age. The basal cisterns are    patent. There are no abnormal extra-axial fluid collections.     The visualized paranasal sinuses and mastoid sinuses are clear. The skull   base and calvarium demonstrate normal signal. The major intracranial flow   voids, including dural venous sinuses appear patent.    IMPRESSION:     No acute intracranial pathology.  Dictation Site: 1        Verified By: Jennette Banker, M.D., MD  Cardiology:    23-Jun-14 19:36, ECG  Ventricular Rate 126  Atrial Rate 159  QRS Duration 82  QT 298  QTc 431  R Axis 22  T Axis 54  ECG interpretation   Atrial fibrillation with rapid ventricular response  Nonspecific ST abnormality , probably digitalis effect  Abnormal ECG  When compared with ECG of 21-Jul-2012 20:10,  No significant change was found  ----------unconfirmed----------  Confirmed by OVERREAD, NOT ([id number]), editor PEARSON, BARBARA ([id number]) on 05/10/2013 8:18:52 AM  ECG     23-Jun-14 20:41, ECG  Ventricular Rate 92  Atrial Rate 113  QRS Duration 90  QT 350  QTc 432  R Axis 11  T Axis 51  ECG interpretation   Atrial fibrillation  Abnormal ECG  When compared with ECG  of 08-May-2013 19:36,  No significant change was found  ----------unconfirmed----------  Confirmed by OVERREAD, NOT ([id number]), editor PEARSON, BARBARA ([id number]) on 05/10/2013 8:21:11 AM  ECG     24-Jun-14 13:45, Echo Doppler  Echo Doppler   REASON FOR EXAM:      COMMENTS:       PROCEDURE: [place] - ECHO DOPPLER COMPLETE(TRANSTHOR)  - May 09 2013  1:45PM     RESULT: Echocardiogram Report    Patient Name:   [name] Date of Exam: 05/09/2013  Medical Rec #:  [id number]             Custom1:  Date of Birth:  26-Jul-1934          Height:       73.0 in  Patient Age:    46 years           Weight:       246.0 lb  Patient Gender: M                  BSA:          2.35 m??    Indications: Atrial Fib  Sonographer:    Sherrie Sport RDCS  Referring Phys: Lynett Fish, B    Summary:   1. Left ventricular ejection fraction, by  visual estimation, is 60 to   65%.   2. Normal global left ventricular systolic function.   3. Mild thickening of the anterior and posterior mitral valve leaflets.   4. Mild aortic valve sclerosis without stenosis.   5. Mildly increased left ventricular posterior wall thickness.   6. Mild tricuspid regurgitation.  2D AND M-MODE MEASUREMENTS (normal ranges within parentheses):  Left Ventricle:          Normal  IVSd (2D):     1.24 cm (0.7-1.1)  LVPWd (2D):     1.21 cm (0.7-1.1) Aorta/LA:                  Normal  LVIDd (2D):     4.39 cm (3.4-5.7) Aortic Root (2D): 3.10 cm (2.4-3.7)  LVIDs (2D):     2.82 cm           Left Atrium (2D): 3.10 cm (1.9-4.0)  LV FS (2D):35.8 %   (>25%)  LV EF (2D):     65.5 %   (>50%)                                    Right Ventricle:                                    RVd (2D):        0.05 cm  LV DIASTOLIC FUNCTION:  MV Peak E: 0.78 m/s E/e' Ratio: 7.60  MV Peak A: 0.61 m/s Decel Time: 246 msec  E/A Ratio: 1.27  SPECTRAL DOPPLER ANALYSIS (where applicable):  Mitral Valve:  MV P1/2 Time: 71.34 msec  MV Area, PHT: 3.08 cm??  Aortic Valve: AoV Max Vel: 1.00 m/s AoV Peak PG: 4.0 mmHg AoV Mean PG:  LVOT Vmax: 0.81 m/s LVOT VTI:  LVOT Diameter: 2.20 cm  AoV Area, Vmax: 3.08 cm?? AoV Area, VTI:  AoV Area, Vmn:  Tricuspid Valve and PA/RV Systolic Pressure: TR Max Velocity: 1.45 m/s RA   Pressure: 5 mmHg RVSP/PASP: 13.4 mmHg  Pulmonic  Valve:  PV Max Velocity: 0.84 m/s PV Max PG: 2.8 mmHg PV Mean PG:    PHYSICIAN INTERPRETATION:  Left Ventricle: The left ventricular internal cavity size was normal. LV   septal wall thickness was normal. LV posterior wall thickness was mildly   increased. Global LV systolic function was normal. Left ventricular   ejection fraction, by visual estimation, is 60 to 65%.  Right Ventricle: The right ventricular size is normal.  Left Atrium: The left atrium is normal in size.  Right Atrium: The right atrium is normal in  size.  Pericardium: There is no evidence of pericardial effusion.  Mitral Valve: The mitral valve is normal in structure. There is mild   thickening of the anterior and posterior mitral valve leaflets. Trace   mitral valve regurgitation is seen.  Tricuspid Valve: The tricuspid valve is normal. Mild tricuspid   regurgitation is visualized. The tricuspid regurgitant velocity is 1.45   m/s, and with an assumed right atrial pressure of 5 mmHg, the estimated   right ventricular systolic pressure is normal at 13.4 mmHg.  Aortic Valve: The aortic valve is normal. Mild aortic valve sclerosis is   present, with no evidence of aortic valve stenosis.  Pulmonic Valve: The pulmonic valve is normal.    [place] MD  Electronically signed by [place] MD  Signature Date/Time: 05/09/2013/5:19:00 PM  *** Final ***    IMPRESSION: .        Verified By: Yolonda Kida, M.D., MD   Assessment/Plan:  Invasive Device Daily Assessment of Necessity:  Does the patient currently have any of the following indwelling devices? none   Assessment/Plan:  Assessment IMP AFIB-NSR paraxsymal HxCVA Dementia Confusion HTN Hyperliidemia Non compliance .   Plan PLAN ASA 81 mg daily Tele  Rec xarelto 20 mg daily Rate control with diltiazem daily Consider rhythm control with Amiodarone Statin therapy for lipids Care management consult Psychiatry involment for management input ASA 81qd/cardiazem cd 180 qd/xarelto 20 mgqd   Electronic Signatures: Lujean Amel D (MD)  (Signed 27-Jun-14 07:28)  Authored: Chief Complaint, VITAL SIGNS/ANCILLARY NOTES, Brief Assessment, Lab Results, Radiology Results, Assessment/Plan   Last Updated: 27-Jun-14 07:28 by Lujean Amel D (MD)

## 2015-03-08 NOTE — Discharge Summary (Signed)
PATIENT NAME:  [name], [name] MR#:  [id number] DATE OF BIRTH:  [birth year]  DATE OF ADMISSION:  05/08/2013 DATE OF DISCHARGE:  05/12/2013  HISTORY OF PRESENT ILLNESS: [name] is Andrews 81 year old white gentleman with Andrews history of paroxysmal atrial fibrillation and previous cerebrovascular accident, who presented to the Emergency Room with palpitations. He was found to have atrial fibrillation with Andrews rate of 138. Although the patient was supposed to be on Cardizem CD and Xarelto, he was on no medications at the time of presentation.   The patient's past medical history in addition to his  PAF  and previous CVA, was notable for previous surgery for Andrews broken arm in the distant past.   ADMISSION PHYSICAL EXAMINATION: As described by the admitting physician revealed Andrews temperature of 97.5, blood pressure 113/67, pulse of 114, respirations 20. Exam was unremarkable with the exception of an irregular/irregular rate. No murmurs were appreciated.   The patient's admission chest x-ray showed no acute disease. Admission EKG showed atrial fibrillation with Andrews ventricular response of 126.   HOSPITAL COURSE: The patient was admitted to observation where he was placed on Cardizem every 6 hours. He was also placed back on Xarelto. He was seen in consultation by cardiology. Difficulty was noted in keeping him on Andrews maintenance medication for his heart rate due to the development of bradycardia. He did; however, remain in atrial fibrillation initially, but converted to Andrews normal sinus rhythm and was in Andrews normal sinus rhythm at the time of discharge. Echocardiogram showed Andrews left ventricular ejection fraction of 60% to 65%. There was mild thickening of the anterior and posterior mitral valve leaflets. The aortic valve was sclerotic, but there was no stenosis. There was mildly increased left ventricular posterior wall thickness. There was mild tricuspid regurgitation. Admission CBC showed Andrews hemoglobin of 15.8 with Andrews hematocrit of  47.5. Admission comprehensive metabolic panel was notable only for an SGOT of 38. Serial cardiac enzymes were unremarkable. The patient was ambulated without difficulty. He was continued on Xarelto. He was not discharged on any rate controlling medications due to bradycardia.   DISCHARGE DIAGNOSIS: Paroxysmal atrial fibrillation.   DISCHARGE MEDICATIONS:  1.  Aspirin 81 mg daily.  2.  Xarelto 20 mg daily.   DISCHARGE DISPOSITION: The patient was discharged with activity as tolerated. Home health is being set up to try and make sure he is compliant with his medications. The patient will be followed up in the office in 1 to 2 weeks.  ____________________________ Letta PateJohn B. Danne HarborWalker III, MD [email or web address] D: 06/05/2013 06:11:59 ET T: 06/05/2013 08:36:31 ET JOB#: [id number]  cc: Jonny RuizJohn B. Danne HarborWalker III, MD, <Dictator> Elmo PuttJOHN B WALKER III MD ELECTRONICALLY SIGNED 06/07/2013 8:02

## 2015-03-08 NOTE — Consult Note (Signed)
Brief Consult Note: Diagnosis: AFIB.   Patient was seen by consultant.   Consult note dictated.   Recommend further assessment or treatment.   Orders entered.   Discussed with Attending MD.   Comments: IMP AFIB HTN Tachycardia Hx CVA PVD Dementia? Non compliance . PLAN Tele ASA 81mg  QD Xarelto 20 mg daily for long term anticoug DiltiazemCD 180 mg daily Consider Statin therapy Agree with ECHO Consider functional study as outpt Rec evaluation for dementia.  Electronic Signatures: [name], [name] (MD)  (Signed 24-Jun-14 17:49)  Authored: Brief Consult Note   Last Updated: 24-Jun-14 17:49 by [name], Kadance Mccuistion Andrews (MD)

## 2015-03-08 NOTE — Consult Note (Signed)
PATIENT NAME:  [name], [name] MR#:  [id number] DATE OF BIRTH:  1934-02-11  DATE OF CONSULTATION:  05/11/2013  REFERRING PHYSICIAN:  Dr. Dan HumphreysWalker. CONSULTING PHYSICIAN:  Chaniya Genter S. Garnetta BuddyFaheem, MD  REASON FOR CONSULTATION:  "Confusion" and capacity evaluation.   HISTORY OF PRESENT ILLNESS:  The patient is Andrews 81 year old, divorced male who was admitted after he started having Andrews rapid heartbeat. The patient reported that he drove himself to the hospital as he started feeling that his heart was beating very fast. He reported that he has history of atrial fibrillation in the past and he was admitted previously for the same complaint. He reported that he called his son, who lives in [place], and then decided to come to the hospital. The patient stated that he currently lives by himself. He stated that he had Andrews similar episode in the past and at that time, he was in told by Dr. Katrinka Blazingsmith not to play golf. He reported that he kept those words in his mind and he never played golf, especially in the hot weather. The patient stated that he currently lives by himself and enjoys his time by watching [place] basketball and watching movies. He stated that when he presented to the Emergency Room, his heart rate was up to 138 and the EKG showed atrial fibrillation. He was not taking any home medications. The patient reported that he is not having any issues at this time. He is looking forward to be discharged back home. He reported that he is very close to his two sons and one of them lives in [place] and the other lives in [place]. He reported that both of them has two boys. He currently denied feeling depressed or anxious. He reported that he is also Andrews very close to his ex-wife and she also came to visit him in the hospital.   PAST PSYCHIATRIC HISTORY:  The patient reported that he has never felt depressed or anxious and does not want to take any psychotropic medication. He reported that he has some problems with his memory  issues and especially trouble remembering things which has happened in the past. He was unable to tell me which company he retired from when he was 81 years old. He reported that he worked at 4 to 5 different jobs in [employer] but was unable to come up with the name of the company where he retired from. 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 He stated that he also was [place].   SOCIAL HISTORY:  The patient is currently divorced after being married for 15 years. He reported that he is now friends with his ex-wife. He has two sons and he is very close to his younger son. He reported that he spends time with his grandchildren as well. He does not have any pending legal charges. The patient reported that he was born in [place] and spent some of his young childhood years in  that area. After that he relocated to [place]. 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 He retired at the age of 81. He was only married for 15 years and has two sons.   ALLERGIES:  No known drug allergies.   CURRENT MEDICATIONS:  None.  REVIEW OF SYSTEMS: CONSTITUTIONAL:  The patient currently denies any fever or chills. No weight changes.  EYES:  No  double or blurred vision.  RESPIRATORY:  No shortness of breath or cough.  CARDIOVASCULAR:  The patient came in with chest pain and rapid heart beat. He currently denied having any palpitations.  PULMONARY:  No cough, sputum, shortness of breath.  GASTROINTESTINAL:  No abdominal pain, nausea, vomiting, diarrhea.  GENITOURINARY:  No dysuria or hematuria noted.  SKIN:  No rash or jaundice.  ENDOCRINE:  No heat or cold intolerance noted.   PHYSICAL EXAMINATION: VITAL SIGNS:  Temperature 97.5, pulse 114, respirations 20, blood pressure 113/67.  LABORATORY, DIAGNOSTIC, AND RADIOLOGICAL DATA:  Glucose 98, BUN 13,  creatinine 1.13, sodium 142, potassium 4.2, chloride 108, bicarbonate 27, anion gap 7, LDL 30, magnesium 1.8, cholesterol 174. Protein 7.1, bilirubin 0.6, AST 38, ALT 32. CK total 257, thyroxine 1.09. WBC 5.7, RBC 5.01, MCV 82.   MENTAL STATUS EXAMINATION:  The patient is Andrews moderately built male who appeared his stated age. He was calm and cooperative. His speech was normal in tone and volume. His mood was fine. Affect was congruent. Thought processes appear to be logical and goal-directed. Thought content was nondelusional. He currently denied having any suicidal ideation or plan. He appeared to have some long-term memory deficit in some areas. His short-term memory appeared intact. He was awake, alert and oriented x3. He denied having any perceptual disturbances.   DIAGNOSTIC IMPRESSION:  AXIS I:  Vascular dementia, uncomplicated, without behavioral disturbances.  AXIS II:  None.  AXIS III:  Andrews history of atrial fibrillation.   TREATMENT PLAN:  I discussed with the patient at length about his living situation and he reported that he enjoys living by himself, and he is also going to discuss with his sons about obtaining power of attorney at this time. The patient appeared to have the capacity to make decision about his circumstances. He appeared to have some cognitive deficits related to his decline in the memory but he is lucid at this time. He does not appear to have sharp decline in the memory. He responded to most of the questions very well, organized fashion. The patient does not appear to be in an urgent need to start any psychotropic medications. He can be discharged home safely with his family and then if he needed, he can follow up the neurologist or Psychiatrast.    Thank you for allowing me to participate in the care of this patient.   ____________________________ Ardeen Fillers. Garnetta Buddy, MD [email or web address] D: 05/11/2013 16:17:17 ET T: 05/11/2013 16:30:33 ET JOB#: [id number]  cc: Ardeen Fillers. Garnetta Buddy, MD,  <Dictator> Rhunette Croft MD ELECTRONICALLY SIGNED 05/15/2013 13:25

## 2018-07-08 ENCOUNTER — Emergency Department
Admission: EM | Admit: 2018-07-08 | Discharge: 2018-07-09 | Disposition: A | Discharge status: Home / Self Care | Payer: Medicare Other | Attending: Emergency Medicine | Admitting: Emergency Medicine

## 2018-07-08 ENCOUNTER — Emergency Department: Payer: Medicare Other

## 2018-07-08 ENCOUNTER — Other Ambulatory Visit: Payer: Self-pay

## 2018-07-08 DIAGNOSIS — R4182 Altered mental status, unspecified: Secondary | ICD-10-CM | POA: Diagnosis not present

## 2018-07-08 DIAGNOSIS — F01518 Vascular dementia, unspecified severity, with other behavioral disturbance: Secondary | ICD-10-CM

## 2018-07-08 DIAGNOSIS — R410 Disorientation, unspecified: Secondary | ICD-10-CM

## 2018-07-08 DIAGNOSIS — F0391 Unspecified dementia with behavioral disturbance: Secondary | ICD-10-CM

## 2018-07-08 DIAGNOSIS — R451 Restlessness and agitation: Secondary | ICD-10-CM | POA: Insufficient documentation

## 2018-07-08 DIAGNOSIS — F0151 Vascular dementia with behavioral disturbance: Secondary | ICD-10-CM | POA: Diagnosis not present

## 2018-07-08 LAB — CBC
HEMATOCRIT: 49.1 % (ref 40.0–52.0)
HEMOGLOBIN: 16.4 g/dL (ref 13.0–18.0)
MCH: 28.8 pg (ref 26.0–34.0)
MCHC: 33.4 g/dL (ref 32.0–36.0)
MCV: 86.2 fL (ref 80.0–100.0)
PLATELETS: 147 10*3/uL — AB (ref 150–440)
RBC: 5.69 MIL/uL (ref 4.40–5.90)
RDW: 16.1 % — ABNORMAL HIGH (ref 11.5–14.5)
WBC: 6.2 10*3/uL (ref 3.8–10.6)

## 2018-07-08 LAB — COMPREHENSIVE METABOLIC PANEL
ALT: 19 U/L (ref 0–44)
ANION GAP: 12 (ref 5–15)
AST: 30 U/L (ref 15–41)
Albumin: 4.7 g/dL (ref 3.5–5.0)
Alkaline Phosphatase: 58 U/L (ref 38–126)
BUN: 11 mg/dL (ref 8–23)
CHLORIDE: 106 mmol/L (ref 98–111)
CO2: 23 mmol/L (ref 22–32)
CREATININE: 1.13 mg/dL (ref 0.61–1.24)
Calcium: 9 mg/dL (ref 8.9–10.3)
GFR calc non Af Amer: 59 mL/min — ABNORMAL LOW (ref >60)
Glucose, Bld: 122 mg/dL — ABNORMAL HIGH (ref 70–99)
POTASSIUM: 3.2 mmol/L — AB (ref 3.5–5.1)
Sodium: 141 mmol/L (ref 135–145)
Total Bilirubin: 1.3 mg/dL — ABNORMAL HIGH (ref 0.3–1.2)
Total Protein: 7.4 g/dL (ref 6.5–8.1)

## 2018-07-08 LAB — ETHANOL: Alcohol, Ethyl (B): <10 mg/dL (ref <10)

## 2018-07-08 MED ORDER — VITAMIN B-12 1000 MCG PO TABS
1000.0000 ug | ORAL_TABLET | Freq: Every day | ORAL | Status: DC
  Administered 2018-07-09: 1000 ug via ORAL
  Filled 2018-07-08: qty 1

## 2018-07-08 MED ORDER — QUETIAPINE FUMARATE 25 MG PO TABS
25.0000 mg | ORAL_TABLET | Freq: Every day | ORAL | Status: DC
  Administered 2018-07-09: 25 mg via ORAL
  Filled 2018-07-08: qty 1

## 2018-07-08 MED ORDER — QUETIAPINE FUMARATE 25 MG PO TABS: 50.0000 mg | ORAL_TABLET | Freq: Every day | ORAL | Status: DC

## 2018-07-08 MED ORDER — HALOPERIDOL LACTATE 5 MG/ML IJ SOLN
5.0000 mg | Freq: Once | INTRAMUSCULAR | Status: AC
  Administered 2018-07-08: 5 mg via INTRAMUSCULAR
  Filled 2018-07-08: qty 1

## 2018-07-08 MED ORDER — OLANZAPINE 5 MG PO TABS
2.5000 mg | ORAL_TABLET | ORAL | Status: AC
  Administered 2018-07-08: 2.5 mg via ORAL
  Filled 2018-07-08: qty 1

## 2018-07-08 MED ORDER — RIVAROXABAN 20 MG PO TABS
20.0000 mg | ORAL_TABLET | Freq: Every day | ORAL | Status: DC
  Administered 2018-07-09: 20 mg via ORAL
  Filled 2018-07-08 (×2): qty 1

## 2018-07-08 MED ORDER — MEMANTINE HCL 5 MG PO TABS
10.0000 mg | ORAL_TABLET | Freq: Two times a day (BID) | ORAL | Status: DC
  Administered 2018-07-09: 10 mg via ORAL
  Filled 2018-07-08: qty 2

## 2018-07-08 MED ORDER — DONEPEZIL HCL 5 MG PO TABS: 10.0000 mg | ORAL_TABLET | Freq: Every day | ORAL | Status: DC

## 2018-07-08 NOTE — ED Notes (Signed)
Pt having to be redirected multiple times by this tech and officers

## 2018-07-08 NOTE — ED Notes (Signed)
Pt's son in to visit patient.  Contact info- Ray Kizzie BaneHughes [phone]. He states he and his brother, Cammy Brochuredward Mcadory III, "Trip" are his Power of Attorney.  Pt's caregiver is Deborah [place] (caregiver).  Phone number is [phone].   She lives with patient.  Son reports that pt has started wandering away from his condo during the past 2 weeks.

## 2018-07-08 NOTE — ED Notes (Signed)
BEHAVIORAL HEALTH ROUNDING Patient sleeping: No. Patient alert  Yes -   Confused  Behavior appropriate: Yes.  ; If no, describe:  Nutrition and fluids offered: yes Toileting and hygiene offered: Yes  Sitter present: q15 minute observations and security monitoring Law enforcement present: Yes

## 2018-07-08 NOTE — ED Notes (Signed)
Patient is resting comfortably. 

## 2018-07-08 NOTE — ED Notes (Signed)

## 2018-07-08 NOTE — ED Provider Notes (Addendum)
-----------------------------------------   4:08 PM on 07/08/2018 -----------------------------------------  Dr. Toni Amendlapacs evaluated the patient and is somewhat concerned about the difficulty the patient is having with communication.  This is reportedly a new issue and it almost sounds like a word salad or expressive aphasia.  Dr. Toni Amendlapacs spoke with the patient's son who said that he has been working with Dr. Sherryll BurgerShah with a working diagnosis of vascular or age-related dementia and they are even looking at memory care units.  However the speech difficulty and difficulty formulating coherent since this is a new issue.  Given the possibility that the confusion and speech difficulties could be the result of a CVA, I have ordered an MR brain without contrast to rule out CVA resulting in expressive aphasia.  In the meantime, Dr. Toni Amendlapacs and social work and TTS will continue working on placement either in a memory care unit or geropsych facility assuming that the MRI will likely be normal.   ----------------------------------------- 6:30 PM on 07/08/2018 -----------------------------------------  MRI was degraded given the motion artifact but the radiologist felt that there is no indication of any acute abnormality     ----------------------------------------- 7:43 PM on 07/08/2018 -----------------------------------------  Patient is becoming increasingly agitated, wandering from room to room, and wanting to leave.  For his agitation I have ordered olanzapine 2.5 mg by mouth to see if this will help him calm down.  Given his age it is not appropriate to try Vistaril or benzodiazepines.   ----------------------------------------- 8:31 PM on 07/08/2018 -----------------------------------------  Patient is taking the Zyprexa but is increasingly agitated and getting angry, increasingly difficult to redirect, continues to try to leave the emergency department.  He is becoming a danger to himself and  others.  I have ordered haloperidol 5 mg intramuscular as a calming agent.  Once he is calm down we will try to obtain an EKG, but a prior EKG on record indicates no QT prolongation and I think that keeping him safe and our staff safe is more important than trying to obtain an EKG on an agitated patient.   Loleta RoseForbach, Naira Standiford, MD 07/08/18 2342

## 2018-07-08 NOTE — ED Notes (Signed)
Pt declined to allow me to check his FSBS

## 2018-07-08 NOTE — ED Notes (Signed)
Pt. Continues to get up from bed and attempting to enter other patient rooms.  Pt. Redirected to hallway bed.   Pt. Requesting to go home.  Pt. Told he is in hospital and doctor wants him to stay.  Pt. Will respond "Is [place]".  Pt. Reassured he is [place].

## 2018-07-08 NOTE — Consult Note (Signed)
[facility] Face-to-Face Psychiatry Consult   Reason for Consult: Consult for this 84 year old man with a history of dementia brought in after wandering and agitated behavior Referring Physician: Corky Downs Patient Identification: [name]. MRN:  [id number] Principal Diagnosis: Dementia, multiinfarct, with behavioral disturbance Diagnosis:   Patient Active Problem List   Diagnosis Date Noted  . Dementia, multiinfarct, with behavioral disturbance [F01.51] 07/08/2018    Total Time spent with patient: 1 hour  Subjective:   [name]. is a 84 y.o. male patient admitted with "I came to check the umbrellas".  HPI: Patient interviewed chart reviewed.  Also spoke with both of the patient's sons about his condition.  Reviewed old notes including neurology notes.  84 year old man with a known history of dementia was wandering away from home today.  Bizarre behavior bizarre speech.  Sons report that although he has had dementia for quite a while his behavior and his thinking has worsened significantly over the last couple weeks.  In my interview with the patient what is most striking is the degree of speech difficulty he has.  Patient has a fluent like pros that he to his speech but frequently will insert words that make absolutely no sense.  Most of his answers are nonsensical and they will not repeat meaning that asked the same question repeatedly he will give different answers.  He has a somewhat irritated affect but has not been aggressive or threatening and has been cooperative since being here.  Patient has been seeing a neurologist for dementia.  Recently Seroquel was added.  Already on Namenda and Aricept.  Medical history: Patient has a distant history of a pretty serious stroke.  He is on anticoagulant medicine.  Vitamin B12 deficiency.  Social history: Patient has been living independently although the sons have been providing a caregiver who is living in with him.  Sons are aware that  things have been getting worse and have already begun the process of looking at locked memory units.  Substance abuse history: No history of alcohol or drug abuse.  Past Psychiatric History: Other than to mention no past psychiatric history.  No history of depression or psychosis or psychiatric hospitalization or suicidality.  Risk to Self:   Risk to Others:   Prior Inpatient Therapy:   Prior Outpatient Therapy:    Past Medical History:  Past Medical History:  Diagnosis Date  . Acute CVA (cerebrovascular accident)    subacute  . Chronic back pain   . PAF (paroxysmal atrial fibrillation)     Family History:  Family History  Problem Relation Age of Onset  . Hypertension Unknown        family history   Family Psychiatric  History: None identified Social History:  Social History   Substance and Sexual Activity  Alcohol Use Not on file     Social History   Substance and Sexual Activity  Drug Use Not on file    Social History   Socioeconomic History  . Marital status: Divorced    Spouse name: Not on file  . Number of children: Not on file  . Years of education: Not on file  . Highest education level: Not on file  Occupational History  . Not on file  Social Needs  . Financial resource strain: Not on file  . Food insecurity:    Worry: Not on file    Inability: Not on file  . Transportation needs:    Medical: Not on file  Non-medical: Not on file  Tobacco Use  . Smoking status: Not on file  Substance and Sexual Activity  . Alcohol use: Not on file  . Drug use: Not on file  . Sexual activity: Not on file  Lifestyle  . Physical activity:    Days per week: Not on file    Minutes per session: Not on file  . Stress: Not on file  Relationships  . Social connections:    Talks on phone: Not on file    Gets together: Not on file    Attends religious service: Not on file    Active member of club or organization: Not on file    Attends meetings of clubs or  organizations: Not on file    Relationship status: Not on file  Other Topics Concern  . Not on file  Social History Narrative  . Not on file   Additional Social History:    Allergies:  Not on File  Labs:  Results for orders placed or performed during the hospital encounter of 07/08/18 (from the past 48 hour(s))  Comprehensive metabolic panel     Status: Abnormal   Collection Time: 07/08/18 11:44 AM  Result Value Ref Range   Sodium 141 135 - 145 mmol/L   Potassium 3.2 (L) 3.5 - 5.1 mmol/L   Chloride 106 98 - 111 mmol/L   CO2 23 22 - 32 mmol/L   Glucose, Bld 122 (H) 70 - 99 mg/dL   BUN 11 8 - 23 mg/dL   Creatinine, Ser 1.13 0.61 - 1.24 mg/dL   Calcium 9.0 8.9 - 10.3 mg/dL   Total Protein 7.4 6.5 - 8.1 g/dL   Albumin 4.7 3.5 - 5.0 g/dL   AST 30 15 - 41 U/L   ALT 19 0 - 44 U/L   Alkaline Phosphatase 58 38 - 126 U/L   Total Bilirubin 1.3 (H) 0.3 - 1.2 mg/dL   GFR calc non Af Amer 59 (L) >60 mL/min   GFR calc Af Amer >60 >60 mL/min    Comment: (NOTE) The eGFR has been calculated using the CKD EPI equation. This calculation has not been validated in all clinical situations. eGFR's persistently <60 mL/min signify possible Chronic Kidney Disease.    Anion gap 12 5 - 15    Comment: Performed at [facility], [address] [place], [place] [address]  CBC     Status: Abnormal   Collection Time: 07/08/18 11:44 AM  Result Value Ref Range   WBC 6.2 3.8 - 10.6 K/uL   RBC 5.69 4.40 - 5.90 MIL/uL   Hemoglobin 16.4 13.0 - 18.0 g/dL   HCT 49.1 40.0 - 52.0 %   MCV 86.2 80.0 - 100.0 fL   MCH 28.8 26.0 - 34.0 pg   MCHC 33.4 32.0 - 36.0 g/dL   RDW 16.1 (H) 11.5 - 14.5 %   Platelets 147 (L) 150 - 440 K/uL    Comment: Performed at [facility], [address] [place],  [address]    Current Facility-Administered Medications  Medication Dose Route Frequency Provider Last Rate Last Dose  . donepezil (ARICEPT) tablet 10 mg  10 mg Oral QHS Wiley Flicker T, MD       . QUEtiapine (SEROQUEL) tablet 25 mg  25 mg Oral Daily Kiam Bransfield T, MD      . QUEtiapine (SEROQUEL) tablet 50 mg  50 mg Oral QHS Jaree Trinka, Madie Reno, MD      . rivaroxaban (XARELTO) tablet 20  mg  20 mg Oral Daily Prentiss Hammett T, MD      . vitamin B-12 (CYANOCOBALAMIN) tablet 1,000 mcg  1,000 mcg Oral Daily Rahman Ferrall, Madie Reno, MD       No current outpatient medications on file.    Musculoskeletal: Strength & Muscle Tone: within normal limits Gait & Station: normal Patient leans: N/A  Psychiatric Specialty Exam: Physical Exam  Nursing note and vitals reviewed. Constitutional: He appears well-developed and well-nourished.  HENT:  Head: Normocephalic and atraumatic.  Eyes: Pupils are equal, round, and reactive to light. Conjunctivae are normal.  Neck: Normal range of motion.  Cardiovascular: Regular rhythm and normal heart sounds.  Respiratory: Effort normal. No respiratory distress.  GI: Soft.  Musculoskeletal: Normal range of motion.  Neurological: He is alert.  Skin: Skin is warm and dry.  Psychiatric: His affect is inappropriate. His speech is tangential. He is not agitated and not aggressive. Thought content is delusional. Cognition and memory are impaired. He expresses impulsivity and inappropriate judgment. He expresses no homicidal and no suicidal ideation. He exhibits abnormal recent memory and abnormal remote memory.    Review of Systems  Constitutional: Negative.   HENT: Negative.   Eyes: Negative.   Respiratory: Negative.   Cardiovascular: Negative.   Gastrointestinal: Negative.   Musculoskeletal: Negative.   Skin: Negative.   Neurological: Negative.   Psychiatric/Behavioral: Positive for memory loss. Negative for depression, hallucinations, substance abuse and suicidal ideas. The patient is nervous/anxious. The patient does not have insomnia.     Blood pressure (!) 160/76, pulse (!) 116, temperature 98.3 F (36.8 C), temperature source Oral, resp. rate 18, height  6' (1.829 m), weight 90.7 kg, SpO2 98 %.Body mass index is 27.12 kg/m.  General Appearance: Fairly Groomed  Eye Contact:  Good  Speech:  As I noted above his speech is clear and not garbled but makes almost no sense.  Volume:  Decreased  Mood:  Euthymic  Affect:  Constricted  Thought Process:  Disorganized  Orientation:  Negative  Thought Content:  Illogical  Suicidal Thoughts:  No  Homicidal Thoughts:  No  Memory:  Immediate;   Fair Recent;   Poor Remote;   Poor  Judgement:  Poor  Insight:  Lacking  Psychomotor Activity:  Restlessness  Concentration:  Concentration: Poor  Recall:  Poor  Fund of Knowledge:  Poor  Language:  Poor  Akathisia:  No  Handed:  Right  AIMS (if indicated):     Assets:  Housing Physical Health Resilience Social Support  ADL's:  Impaired  Cognition:  Impaired,  Severe  Sleep:        Treatment Plan Summary: Daily contact with patient to assess and evaluate symptoms and progress in treatment, Medication management and Plan 84 year old man with dementia which appears to have had a rapid worsening.  Because of both the rapid course of its worsening and the striking language features I suggested that we rescan his head.  MRI however does not show any new infarct or specific change from previous exams.  The rest of the labs are unremarkable.  I have started him back on the Seroquel but increase the dose a little bit to 50 mg at night and 20 5 in the morning while continuing his other medicines.  I have discussed the case with TTS with social work with the emergency room doctor and with the patient's family.  Options would include geriatric psychiatry or placement in a memory unit although honestly I think placement in a memory  unit if available would probably be the better option since he is not really aggressive just wondering.  Continue current orders.  Order placed for social work consult as well.  Disposition: Supportive therapy provided about ongoing  stressors.  Alethia Berthold, MD 07/08/2018 6:38 PM

## 2018-07-08 NOTE — ED Notes (Signed)
Patient transported to CT 

## 2018-07-08 NOTE — ED Provider Notes (Signed)
[facility] Emergency Department Provider Note   ____________________________________________    I have reviewed the triage vital signs and the nursing notes.   HISTORY  Chief Complaint Altered Mental Status   History limited, patient refusing to speak with me  HPI [name]. is a 84 y.o. male arrives with [employer] police department in handcuffs under involuntary commitment.  Patient apparently walked away from his home and was throwing rocks from a rock wall be extremely aggressive with officers as a person.  He is confused and apparently has a history of dementia  Past Medical History:  Diagnosis Date  . Acute CVA (cerebrovascular accident)    subacute  . Chronic back pain   . PAF (paroxysmal atrial fibrillation)     There are no active problems to display for this patient.     Prior to Admission medications   Not on File     Allergies Patient has no allergy information on record.  Family History  Problem Relation Age of Onset  . Hypertension Unknown        family history    Social History Social History   Tobacco Use  . Smoking status: Not on file  Substance Use Topics  . Alcohol use: Not on file  . Drug use: Not on file    5 caveat: Unable to obtain full review of Systems due to patient refusal    ____________________________________________   PHYSICAL EXAM:  VITAL SIGNS: ED Triage Vitals  Enc Vitals Group     BP 07/08/18 1151 (!) 160/76     Pulse Rate 07/08/18 1151 (!) 116     Resp 07/08/18 1151 18     Temp 07/08/18 1151 98.3 F (36.8 C)     Temp Source 07/08/18 1151 Oral     SpO2 07/08/18 1151 98 %     Weight 07/08/18 1148 90.7 kg (200 lb)     Height 07/08/18 1148 1.829 m (6')     Head Circumference --      Peak Flow --      Pain Score 07/08/18 1147 0     Pain Loc --      Pain Edu? --      Excl. in GC? --     Constitutional: Alert, irritable no acute distress Eyes: Conjunctivae are  normal.   Nose: No congestion/rhinnorhea. Mouth/Throat: Mucous membranes are moist.    Cardiovascular: Sinus tachycardia, regular rhythm. Grossly normal heart sounds.  Good peripheral circulation. Respiratory: Normal respiratory effort.  No retractions.  Gastrointestinal: Soft and nontender. No distention.    Musculoskeletal:.  Warm and well perfused Neurologic:  Normal speech and language. No gross focal neurologic deficits are appreciated.  Skin:  Skin is warm, dry and intact. No rash noted.   ____________________________________________   LABS (all labs ordered are listed, but only abnormal results are displayed)  Labs Reviewed  COMPREHENSIVE METABOLIC PANEL - Abnormal; Notable for the following components:      Result Value   Potassium 3.2 (*)    Glucose, Bld 122 (*)    Total Bilirubin 1.3 (*)    GFR calc non Af Amer 59 (*)    All other components within normal limits  CBC - Abnormal; Notable for the following components:   RDW 16.1 (*)    Platelets 147 (*)    All other components within normal limits  URINALYSIS, COMPLETE (UACMP) WITH MICROSCOPIC  CBG MONITORING, ED   ____________________________________________  EKG  None ____________________________________________  RADIOLOGY  None ____________________________________________   PROCEDURES  Procedure(s) performed: No  Procedures   Critical Care performed: No ____________________________________________   INITIAL IMPRESSION / ASSESSMENT AND PLAN / ED COURSE  Pertinent labs & imaging results that were available during my care of the patient were reviewed by me and considered in my medical decision making (see chart for details).  Patient presents with irritable aggressive behavior, found throwing rocks as discussed above.  Have consulted psychiatry and TTS for evaluation.  Heart rate is improved in the emergency department, he is no longer Tachycardic and is medically cleared for psychiatric  evaluation    ____________________________________________   FINAL CLINICAL IMPRESSION(S) / ED DIAGNOSES  Final diagnoses:  Confusion  Dementia with behavioral disturbance, unspecified dementia type        Note:  This document was prepared using Dragon voice recognition software and may include unintentional dictation errors.    Jene EveryKinner, Leslee Haueter, MD 07/08/18 1455

## 2018-07-08 NOTE — ED Triage Notes (Addendum)
Pt arrives with bpd in handcuffs with IVC papers on the way, pt walked away from his home and was found at a nearby mall throwing rocks from a rock wall, became aggressive with officers as they approached him stating that he wasn't going to let them keep assaulting him that he was in a competition today. Pt has a skin tear noted to the left forearm area  Pt not answering questions for this RN or officer at this time, appears agitated and tapping bench with fingers constantly  According to bpd son is on the way, unable to complete all questions in triage note

## 2018-07-09 MED ORDER — QUETIAPINE FUMARATE 25 MG PO TABS: ORAL_TABLET | ORAL | 0 refills | Status: DC

## 2018-07-09 NOTE — NC FL2 (Signed)
[place] MEDICAID FL2 LEVEL OF CARE SCREENING TOOL     IDENTIFICATION  Patient Name: [name]. Birthdate: [birth year] Sex: male Admission Date (Current Location): 07/08/2018  [place] and [place] Number:  Chiropodist and Address:  [facility], [address] [place], [place] [address]      Provider Number: [id number]  Attending Physician Name and Address:  No att. providers found  Relative Name and Phone Number:     Alen Matheson [phone]  Current Level of Care: Domiciliary (Rest home) Recommended Level of Care: Assisted Living Facility, Memory Care Prior Approval Number:    Date Approved/Denied:   PASRR Number:   [id number]   Discharge Plan: Home(Patient son will place son in 24-7 supervision in his condo until he can be placed)    Current Diagnoses: Patient Active Problem List   Diagnosis Date Noted  . Dementia, multiinfarct, with behavioral disturbance 07/08/2018    Orientation RESPIRATION BLADDER Height & Weight     Self  Normal Continent Weight: 200 lb (90.7 kg) Height:  6' (182.9 cm)  BEHAVIORAL SYMPTOMS/MOOD NEUROLOGICAL BOWEL NUTRITION STATUS      Continent Diet(Normal)  AMBULATORY STATUS COMMUNICATION OF NEEDS Skin   Independent Verbally Normal                       Personal Care Assistance Level of Assistance  Bathing, Feeding, Dressing, Total care Bathing Assistance: Independent Feeding assistance: Independent Dressing Assistance: Independent Total Care Assistance: Independent   Functional Limitations Info  Sight, Hearing, Speech Sight Info: Adequate Hearing Info: Impaired(Hard of hearing) Speech Info: Adequate    SPECIAL CARE FACTORS FREQUENCY   Vascular Dementia                    Contractures Contractures Info: Not present    Additional Factors Info  Code Status Code Status Info: Full code             Current Medications (07/09/2018):  This is the current hospital active  medication list Current Facility-Administered Medications  Medication Dose Route Frequency Provider Last Rate Last Dose  . donepezil (ARICEPT) tablet 10 mg  10 mg Oral QHS Clapacs, John T, MD      . memantine [facility]) tablet 10 mg  10 mg Oral BID Clapacs, John T, MD      . QUEtiapine (SEROQUEL) tablet 25 mg  25 mg Oral Daily Clapacs, John T, MD      . QUEtiapine (SEROQUEL) tablet 50 mg  50 mg Oral QHS Clapacs, John T, MD      . rivaroxaban (XARELTO) tablet 20 mg  20 mg Oral Daily Clapacs, John T, MD      . vitamin B-12 (CYANOCOBALAMIN) tablet 1,000 mcg  1,000 mcg Oral Daily Clapacs, Jackquline Denmark, MD       Current Outpatient Medications  Medication Sig Dispense Refill  . donepezil (ARICEPT) 10 MG tablet Take 10 mg by mouth at bedtime.  2  . memantine (NAMENDA) 5 MG tablet Take 5 mg by mouth 2 (two) times daily.    . QUEtiapine (SEROQUEL) 25 MG tablet Take 25 mg by mouth 2 (two) times daily.  0  . vitamin B-12 (CYANOCOBALAMIN) 1000 MCG tablet Take 1,000 mcg by mouth daily at 2 PM.    . XARELTO 20 MG TABS tablet Take 20 mg by mouth daily.  5     Discharge Medications: Please see discharge summary for a list of discharge medications.  Relevant Imaging Results:  Relevant Lab Results:   Additional Information SSN [id number]  Makaley Storts, [place] M, [place]

## 2018-07-09 NOTE — Discharge Instructions (Addendum)
We are discharging you home with the instructions and understanding that your family will provide 24-hour care at the house pending memory care placement.  Please bring him back to the urgency room new or worrisome symptoms and have someone stay with him at all times as agreed.  The psychiatrist has increased the cervical doses to 2 pills, 50 mg, at night and 1 pill, 25 mg, in the morning.  Please have him follow closely with his doctor or the PCP listed above

## 2018-07-09 NOTE — ED Provider Notes (Signed)
-----------------------------------------   9:16 AM on 07/09/2018 -----------------------------------------  Patient was brought in for acting somewhat oddly, does have a history of dementia with his mood disturbance, family states this is not entirely typical for him, they have set up 24-hour a day care at the house and they are working on getting him into a memory center, they do not wish him to be sent to a psychiatric facility.  They do not feel he is a danger to themselves or to himself, as long as they can have someone with him.  They do have this set up already they state according to social work, they will have some with them at all times to make sure he does not wander off and they are getting him to appropriate level of care on their own.  I feel this is a happier outcome for the patient.  We will see about trying to get him over to their house for further care.   Jeanmarie PlantMcShane, James A, MD 07/09/18 [phone]

## 2018-07-09 NOTE — ED Notes (Signed)
Pt. Up using bathroom.  Pt. Returned to bed with re-directions.

## 2018-07-09 NOTE — ED Provider Notes (Signed)
-----------------------------------------   7:23 AM on 07/09/2018 -----------------------------------------   Blood pressure (!) 160/76, pulse (!) 116, temperature 98.3 F (36.8 C), temperature source Oral, resp. rate 18, height 6' (1.829 m), weight 90.7 kg, SpO2 98 %.  The patient had no acute events since last update.  Calm and cooperative at this time.  Disposition is pending Psychiatry/Behavioral Medicine team recommendations.     Jeanmarie PlantMcShane, James A, MD 07/09/18 [phone]

## 2018-07-09 NOTE — Clinical Social Work Note (Signed)
Clinical Social Work Assessment  Patient Details  Name: Alejandro Salvodward A Chio Jr. MRN: [id number] Date of Birth: [birth year]  Date of referral:  07/09/18               Reason for consult:  Facility Placement                Permission sought to share information with:  Facility Medical sales representativeContact Representative, Family Supports Permission granted to share information::  Yes, Verbal Permission Granted  Name::     [name] [phone]  Agency::     Relationship::  [facility]  Contact Information:     Housing/Transportation Living arrangements for the past 2 months:  Apartment Source of Information:  Adult Children Patient Interpreter Needed:  None Criminal Activity/Legal Involvement Pertinent to Current Situation/Hospitalization:  No - Comment as needed Significant Relationships:  Adult Children Lives with:  Self, Other (Comment)(Has caregiver day time/son to remain in evening) Do you feel safe going back to the place where you live?  No Need for family participation in patient care:  No (Coment)  Care giving concerns: Son is in the process of memory care placement Private Pay   Social Worker assessment / plan: LCSW introduced myself to patient who is oriented x1. Called and spoke to his son Alejandro: The family has made arrangements [facility] in [place]. Patient will return to his apt condo with care giver and family support 24-7 until patient accepted in this facility. Patient is independent with all his ADLs and is hard of hearing. LCSW completed Fl2 and passr to assist family with placement needs. Son is to pick up and transport patient home. No further needs.  Employment status:  Other (Comment), Retired(Retired Runner, broadcasting/film/videoteacher) Insurance information:  Medicare(United health care) PT Recommendations:  Skilled Nursing Facility Information / Referral to community resources:     Patient/Family's Response to care:  Excellent family support and understands patients  needs  Patient/Family's Understanding of and Emotional Response to Diagnosis, Current Treatment, and Prognosis:  Good understanding  Emotional Assessment Appearance:  Appears younger than stated age Attitude/Demeanor/Rapport:  Gracious, Engaged(Vascular dementia ) Affect (typically observed):  Accepting, Appropriate Orientation:  Oriented to Self(Vascular dementia) Alcohol / Substance use:  Not Applicable Psych involvement (Current and /or in the community):  No (Comment)  Discharge Needs  Concerns to be addressed:  No discharge needs identified Readmission within the last 30 days:  No Current discharge risk:  Cognitively Impaired Barriers to Discharge:  Barriers Resolved   Cheron SchaumannBandi, Domino Holten M, LCSW 07/09/2018, 9:50 AM

## 2018-07-09 NOTE — ED Notes (Signed)
Pt discharged home with his son. VS stable. Pt denies SI/HI. RN reviewed medication changes and discharge instructions with patient's son.  Forms sent home from LCSW as well.  All belongings returned to patient.

## 2018-07-09 NOTE — ED Notes (Signed)
Pt. IVC and pending placement. 

## 2018-08-28 ENCOUNTER — Emergency Department: Payer: Medicare Other

## 2018-08-28 ENCOUNTER — Other Ambulatory Visit: Payer: Self-pay

## 2018-08-28 ENCOUNTER — Emergency Department
Admission: EM | Admit: 2018-08-28 | Discharge: 2018-09-07 | Disposition: A | Discharge status: Home / Self Care | Payer: Medicare Other | Attending: Emergency Medicine | Admitting: Emergency Medicine

## 2018-08-28 DIAGNOSIS — F0151 Vascular dementia with behavioral disturbance: Secondary | ICD-10-CM | POA: Insufficient documentation

## 2018-08-28 DIAGNOSIS — I4510 Unspecified right bundle-branch block: Secondary | ICD-10-CM | POA: Diagnosis not present

## 2018-08-28 DIAGNOSIS — F01518 Vascular dementia, unspecified severity, with other behavioral disturbance: Secondary | ICD-10-CM | POA: Diagnosis present

## 2018-08-28 DIAGNOSIS — Z7901 Long term (current) use of anticoagulants: Secondary | ICD-10-CM | POA: Insufficient documentation

## 2018-08-28 DIAGNOSIS — F0390 Unspecified dementia without behavioral disturbance: Secondary | ICD-10-CM

## 2018-08-28 DIAGNOSIS — F99 Mental disorder, not otherwise specified: Secondary | ICD-10-CM

## 2018-08-28 DIAGNOSIS — R451 Restlessness and agitation: Secondary | ICD-10-CM | POA: Insufficient documentation

## 2018-08-28 DIAGNOSIS — F69 Unspecified disorder of adult personality and behavior: Secondary | ICD-10-CM

## 2018-08-28 DIAGNOSIS — Z046 Encounter for general psychiatric examination, requested by authority: Secondary | ICD-10-CM | POA: Insufficient documentation

## 2018-08-28 DIAGNOSIS — R05 Cough: Secondary | ICD-10-CM | POA: Insufficient documentation

## 2018-08-28 DIAGNOSIS — Z79899 Other long term (current) drug therapy: Secondary | ICD-10-CM | POA: Insufficient documentation

## 2018-08-28 DIAGNOSIS — L89151 Pressure ulcer of sacral region, stage 1: Secondary | ICD-10-CM | POA: Diagnosis not present

## 2018-08-28 DIAGNOSIS — R456 Violent behavior: Secondary | ICD-10-CM | POA: Diagnosis present

## 2018-08-28 DIAGNOSIS — Z111 Encounter for screening for respiratory tuberculosis: Secondary | ICD-10-CM

## 2018-08-28 DIAGNOSIS — F03C0 Unspecified dementia, severe, without behavioral disturbance, psychotic disturbance, mood disturbance, and anxiety: Secondary | ICD-10-CM

## 2018-08-28 LAB — CBC WITH DIFFERENTIAL/PLATELET
Abs Immature Granulocytes: 0.03 10*3/uL (ref 0.00–0.07)
Basophils Absolute: 0 10*3/uL (ref 0.0–0.1)
Basophils Relative: 0 %
EOS ABS: 0.1 10*3/uL (ref 0.0–0.5)
EOS PCT: 2 %
HCT: 43.6 % (ref 39.0–52.0)
Hemoglobin: 14.1 g/dL (ref 13.0–17.0)
IMMATURE GRANULOCYTES: 1 %
LYMPHS ABS: 0.9 10*3/uL (ref 0.7–4.0)
Lymphocytes Relative: 21 %
MCH: 27.7 pg (ref 26.0–34.0)
MCHC: 32.3 g/dL (ref 30.0–36.0)
MCV: 85.7 fL (ref 80.0–100.0)
MONOS PCT: 9 %
Monocytes Absolute: 0.4 10*3/uL (ref 0.1–1.0)
NEUTROS PCT: 67 %
Neutro Abs: 3.1 10*3/uL (ref 1.7–7.7)
PLATELETS: 139 10*3/uL — AB (ref 150–400)
RBC: 5.09 MIL/uL (ref 4.22–5.81)
RDW: 16.4 % — AB (ref 11.5–15.5)
WBC: 4.5 10*3/uL (ref 4.0–10.5)
nRBC: 0 % (ref 0.0–0.2)

## 2018-08-28 LAB — COMPREHENSIVE METABOLIC PANEL
ALBUMIN: 3.9 g/dL (ref 3.5–5.0)
ALK PHOS: 45 U/L (ref 38–126)
ALT: 24 U/L (ref 0–44)
AST: 32 U/L (ref 15–41)
Anion gap: 7 (ref 5–15)
BILIRUBIN TOTAL: 0.8 mg/dL (ref 0.3–1.2)
BUN: 17 mg/dL (ref 8–23)
CALCIUM: 9.2 mg/dL (ref 8.9–10.3)
CO2: 29 mmol/L (ref 22–32)
Chloride: 105 mmol/L (ref 98–111)
Creatinine, Ser: 1.04 mg/dL (ref 0.61–1.24)
GFR calc Af Amer: >60 mL/min (ref >60)
GFR calc non Af Amer: >60 mL/min (ref >60)
GLUCOSE: 126 mg/dL — AB (ref 70–99)
Potassium: 4.5 mmol/L (ref 3.5–5.1)
SODIUM: 141 mmol/L (ref 135–145)
TOTAL PROTEIN: 6.4 g/dL — AB (ref 6.5–8.1)

## 2018-08-28 LAB — ETHANOL: Alcohol, Ethyl (B): <10 mg/dL (ref <10)

## 2018-08-28 LAB — SALICYLATE LEVEL: Salicylate Lvl: <7 mg/dL (ref 2.8–30.0)

## 2018-08-28 LAB — TROPONIN I: Troponin I: <0.03 ng/mL (ref <0.03)

## 2018-08-28 LAB — URINALYSIS, COMPLETE (UACMP) WITH MICROSCOPIC
BACTERIA UA: NONE SEEN
BILIRUBIN URINE: NEGATIVE
Glucose, UA: NEGATIVE mg/dL
HGB URINE DIPSTICK: NEGATIVE
Ketones, ur: NEGATIVE mg/dL
Leukocytes, UA: NEGATIVE
NITRITE: NEGATIVE
PROTEIN: NEGATIVE mg/dL
Specific Gravity, Urine: 1.011 (ref 1.005–1.030)
Squamous Epithelial / LPF: NONE SEEN (ref 0–5)
pH: 6 (ref 5.0–8.0)

## 2018-08-28 LAB — ACETAMINOPHEN LEVEL: Acetaminophen (Tylenol), Serum: <10 ug/mL — ABNORMAL LOW (ref 10–30)

## 2018-08-28 LAB — LIPASE, BLOOD: Lipase: 27 U/L (ref 11–51)

## 2018-08-28 MED ORDER — HALOPERIDOL LACTATE 5 MG/ML IJ SOLN
INTRAMUSCULAR | Status: AC
  Administered 2018-08-28: 5 mg via INTRAMUSCULAR
  Filled 2018-08-28: qty 1

## 2018-08-28 MED ORDER — HALOPERIDOL LACTATE 5 MG/ML IJ SOLN
5.0000 mg | Freq: Once | INTRAMUSCULAR | Status: AC
  Administered 2018-08-28: 5 mg via INTRAMUSCULAR

## 2018-08-28 MED ORDER — LORAZEPAM 2 MG/ML IJ SOLN
2.0000 mg | Freq: Once | INTRAMUSCULAR | Status: AC
  Administered 2018-08-28: 2 mg via INTRAMUSCULAR

## 2018-08-28 MED ORDER — LORAZEPAM 2 MG/ML IJ SOLN
INTRAMUSCULAR | Status: AC
  Administered 2018-08-28: 2 mg via INTRAMUSCULAR
  Filled 2018-08-28: qty 1
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
## 2018-08-29 NOTE — ED Notes (Signed)
Hourly rounding reveals patient in room with sitter at bedside. No complaints, stable, in no acute distress. Q15 minute rounds and monitoring via Rover and Officer to continue.  

## 2018-08-29 NOTE — ED Notes (Signed)
Patient in room punching his hands against each other

## 2018-08-29 NOTE — ED Notes (Signed)
Pt son, Draysen hughs III, called for update regarding pt. Unable to provide update at this time, waiting for social work consult. Pt son requests call back with update after consult at [phone]

## 2018-08-29 NOTE — ED Notes (Signed)
Helping patient put legs back in bed ,patient striking back

## 2018-08-29 NOTE — ED Notes (Addendum)
Patient trying to get out of bed pulling clothes and sheets off bed and himself.

## 2018-08-29 NOTE — ED Notes (Signed)
Report to include Situation, Background, Assessment, and Recommendations received from [facility]. Patient alert, warm and dry, in no acute distress with sitter at bedside. Patient will not answer questions concerning SI, HI, AVH and pain. Patient made aware of Q15 minute rounds and Psychologist, counselling presence for their safety. Patient/sitter instructed to come to me with needs or concerns.

## 2018-08-29 NOTE — ED Notes (Signed)
Hourly rounding reveals patient in room. No complaints, stable, in no acute distress. Q15 minute rounds and monitoring via Rover and Officer to continue.   

## 2018-08-29 NOTE — Consult Note (Signed)
Psychiatry: Chart reviewed.  TTS has evaluated patient and recommended that he needs no psychiatric treatment and should be referred entirely to social work.  No further psychiatric intervention.

## 2018-08-29 NOTE — ED Notes (Signed)
Floor mats applied to floor at this time

## 2018-08-29 NOTE — ED Notes (Signed)
Pt initially appears agitated when rn entered room. Pt aggressively clapping hands hands. Pt redirected and asked to stop. Pt stops periodically to talk to nurse, then continues to clap. Pt denies any pain and states he is not hungry. Rn attempted to allow pt to urinate with urinal. Pt unable to do so at this time. After attempt to urinate, pt stops continuous clapping. Pt now resting with arms up resting behind head. Pt calm at this time

## 2018-08-29 NOTE — ED Notes (Signed)
Resumed care from lorrie rn.  Sitter with pt.  Dressings to both forearms.  Pt sleeping siderails up and pads on floor.

## 2018-08-29 NOTE — ED Notes (Signed)
Patient pulling and shaking rails .

## 2018-08-29 NOTE — ED Notes (Addendum)
Patient getting out of bed stating he needed to urinate. Patient wanted to stand up to urinate. Requested assistance from [place] and [place]. Able to urinate after standing. Patient did urinate in urinal but some of urine leaked on clothes and floor. Cleaned patient and put on clean scrubs. Patient cooperated.AS

## 2018-08-29 NOTE — ED Notes (Signed)
Sandwich tray given to patient. Patient is currently eating.

## 2018-08-29 NOTE — ED Notes (Signed)
TTS continues to be unable to assess patient at this time.

## 2018-08-29 NOTE — ED Notes (Signed)
Patient trying to get out of bed by scooting to foot of bed. Patient stated needed to relieve himself. Gave him a urinal but he did not urinate.AS

## 2018-08-29 NOTE — ED Notes (Signed)
Patient at bottom of the  bed

## 2018-08-29 NOTE — ED Notes (Signed)
Report off to [employer].

## 2018-08-29 NOTE — ED Notes (Signed)
Patient attempting to get out of bed. Stating  needed to used bathroom wanting to get up to use the toilet, gave him urinal he did not urinate.AS
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
## 2018-08-29 NOTE — Progress Notes (Signed)
LCSW called spoke to Tiffany at [facility]- She will let this worker know if patient can return. Awaiting a call back once Tiffany has cleared this with her DON.  Patient does not meet in patient psychiatric care as per [place].  Called Patient son- Ray Kutter/ Phillipe Junior encouraged him to [employer]. He informed me they have a 24 hour sitter with patient at [facility]. He wants to know what t do if they wont take him back. LCSW suggested they return him to his condo with 2- 1-1- workers.  [employer] LCSW [phone]

## 2018-08-29 NOTE — ED Notes (Signed)
Pt standing with assistance to void.  Pt cooperative.

## 2018-08-29 NOTE — ED Notes (Signed)
IVC/ Consult cancelled/ CSW to work on placement

## 2018-08-29 NOTE — ED Notes (Signed)
This Clinical research associate took  Over care from [employer]. Patient moved to room 24 with safety sitter.

## 2018-08-30 DIAGNOSIS — F0151 Vascular dementia with behavioral disturbance: Secondary | ICD-10-CM

## 2018-08-30 LAB — URINE CULTURE: Culture: NO GROWTH

## 2018-08-30 MED ORDER — RIVAROXABAN 20 MG PO TABS
20.0000 mg | ORAL_TABLET | Freq: Every day | ORAL | Status: DC
  Administered 2018-08-30 – 2018-09-07 (×9): 20 mg via ORAL
  Filled 2018-08-30 (×10): qty 1

## 2018-08-30 MED ORDER — RISPERIDONE 0.5 MG PO TBDP
0.5000 mg | ORAL_TABLET | Freq: Two times a day (BID) | ORAL | Status: DC
  Administered 2018-08-30 – 2018-09-07 (×17): 0.5 mg via ORAL
  Filled 2018-08-30 (×16): qty 1

## 2018-08-30 MED ORDER — HALOPERIDOL LACTATE 5 MG/ML IJ SOLN
2.0000 mg | Freq: Four times a day (QID) | INTRAMUSCULAR | Status: DC | PRN
  Filled 2018-08-30: qty 1

## 2018-08-30 MED ORDER — HALOPERIDOL 5 MG PO TABS
5.0000 mg | ORAL_TABLET | Freq: Once | ORAL | Status: AC
  Administered 2018-08-30: 5 mg via ORAL
  Filled 2018-08-30: qty 1

## 2018-08-30 NOTE — ED Notes (Signed)
BEHAVIORAL HEALTH ROUNDING Patient sleeping: No. Patient alert and oriented: yes Behavior appropriate: Yes.  ; If no, describe:  Nutrition and fluids offered: yes Toileting and hygiene offered: Yes  Sitter present: q15 minute observations and security monitoring Law enforcement present: Yes    

## 2018-08-30 NOTE — ED Notes (Signed)
Snack and beverage given. Sitter assisting with feeding.

## 2018-08-30 NOTE — ED Notes (Signed)
Hourly rounding reveals patient in room with sitter at bedside. No complaints, stable, in no acute distress. Q15 minute rounds and monitoring via Rover and Officer to continue.  

## 2018-08-30 NOTE — ED Provider Notes (Signed)
-----------------------------------------   7:01 AM on 08/30/2018 -----------------------------------------   Blood pressure 137/68, pulse 74, temperature 98.5 F (36.9 C), temperature source Oral, resp. rate 16, SpO2 100 %.  The patient had no acute events since last update.  Calm and cooperative at this time.  Social work is reportedly working on placement.   Loleta Rose, MD 08/30/18 [phone]

## 2018-08-30 NOTE — Consult Note (Signed)
[facility] Face-to-Face Psychiatry Consult   Reason for Consult: Consult for this 84 year old man who came to the emergency room over the weekend because of aggression at his living facility Referring Physician: Burlene Arnt Patient Identification: [name]. MRN:  [id number] Principal Diagnosis: Dementia, multiinfarct, with behavioral disturbance ([place]) Diagnosis:   Patient Active Problem List   Diagnosis Date Noted  . Dementia, multiinfarct, with behavioral disturbance ([place]) [F01.51] 07/08/2018    Total Time spent with patient: 1 hour  Subjective:   [name]. is a 84 y.o. male patient admitted with "I am just working and paying bills".  HPI: Patient seen chart reviewed.  Patient known from previous encounter.  84 year old man with a past history of stroke was sent here from [place] with reports that he had been aggressive and violent.  On first presentation to the emergency room he was agitated and aggressive and kicking out at staff.  On evaluation by TTS patient was deemed to have dementia and to require further placement although at this point social work has seen the patient and determined that the only placement option would be for the patient to go back home.  On evaluation today I found the patient awake and alert in his bed.  I was able to engage him in some conversation although he was listless and did not seem particularly involved.  He did answer questions but frequently seemed uninterested and it did not seem to bother him that he did not know most of the answers.  Did not know where he currently was.  Did not know where he currently lived.  Could not describe any of the events leading to his hospitalization.  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  Denies any current hallucinations denies suicidal or homicidal ideation.  Currently with blunt affect picking away at his dressings.  Does not seem particularly concerned  about his medical condition.  Social history: Patient has 2 adult sons who I believe share power of attorney.  I returned a phone call this morning to 1 of them and am awaiting a call back.  Patient had recently been living at [facility] but this situation seems to have become impossible I believe they have sent [place] paperwork refusing to take him back.  Medical history: Past history of significant stroke and he is now on chronic anticoagulant medicine with Xarelto  Substance abuse history: None identified certainly none recent.  Past Psychiatric History: Patient has a history of a stroke and history of dementia.  Had been seen here in the emergency room earlier in the summer for agitation.  At that time arrangements were ultimately made to get him into [facility].  No previous psychiatric history before that.  I would say that based on my interaction with the patient today compared to how it was last time I saw him he is more or less the same.  Risk to Self:   Risk to Others:   Prior Inpatient Therapy:   Prior Outpatient Therapy:    Past Medical History:  Past Medical History:  Diagnosis Date  . Acute CVA (cerebrovascular accident) ([place])    subacute  . Chronic back pain   . PAF (paroxysmal atrial fibrillation) ([facility])     Family History:  Family History  Problem Relation Age of Onset  . Hypertension Unknown        family history   Family Psychiatric  History: None Social  History:  Social History   Substance and Sexual Activity  Alcohol Use Not on file     Social History   Substance and Sexual Activity  Drug Use Not on file    Social History   Socioeconomic History  . Marital status: Divorced    Spouse name: Not on file  . Number of children: Not on file  . Years of education: Not on file  . Highest education level: Not on file  Occupational History  . Not on file  Social Needs  . Financial resource strain: Not on file  . Food insecurity:    Worry: Not on file     Inability: Not on file  . Transportation needs:    Medical: Not on file    Non-medical: Not on file  Tobacco Use  . Smoking status: Not on file  Substance and Sexual Activity  . Alcohol use: Not on file  . Drug use: Not on file  . Sexual activity: Not on file  Lifestyle  . Physical activity:    Days per week: Not on file    Minutes per session: Not on file  . Stress: Not on file  Relationships  . Social connections:    Talks on phone: Not on file    Gets together: Not on file    Attends religious service: Not on file    Active member of club or organization: Not on file    Attends meetings of clubs or organizations: Not on file    Relationship status: Not on file  Other Topics Concern  . Not on file  Social History Narrative  . Not on file   Additional Social History:    Allergies:  No Known Allergies  Labs:  Results for orders placed or performed during the hospital encounter of 08/28/18 (from the past 48 hour(s))  Comprehensive metabolic panel     Status: Abnormal   Collection Time: 08/28/18  8:46 PM  Result Value Ref Range   Sodium 141 135 - 145 mmol/L   Potassium 4.5 3.5 - 5.1 mmol/L   Chloride 105 98 - 111 mmol/L   CO2 29 22 - 32 mmol/L   Glucose, Bld 126 (H) 70 - 99 mg/dL   BUN 17 8 - 23 mg/dL   Creatinine, Ser 1.04 0.61 - 1.24 mg/dL   Calcium 9.2 8.9 - 10.3 mg/dL   Total Protein 6.4 (L) 6.5 - 8.1 g/dL   Albumin 3.9 3.5 - 5.0 g/dL   AST 32 15 - 41 U/L   ALT 24 0 - 44 U/L   Alkaline Phosphatase 45 38 - 126 U/L   Total Bilirubin 0.8 0.3 - 1.2 mg/dL   GFR calc non Af Amer >60 >60 mL/min   GFR calc Af Amer >60 >60 mL/min    Comment: (NOTE) The eGFR has been calculated using the CKD EPI equation. This calculation has not been validated in all clinical situations. eGFR's persistently <60 mL/min signify possible Chronic Kidney Disease.    Anion gap 7 5 - 15    Comment: Performed at [facility], [address] [place], [place] [address]  CBC  with Differential     Status: Abnormal   Collection Time: 08/28/18  8:46 PM  Result Value Ref Range   WBC 4.5 4.0 - 10.5 K/uL   RBC 5.09 4.22 - 5.81 MIL/uL   Hemoglobin 14.1 13.0 - 17.0 g/dL   HCT 43.6 39.0 - 52.0 %   MCV 85.7 80.0 - 100.0  fL   MCH 27.7 26.0 - 34.0 pg   MCHC 32.3 30.0 - 36.0 g/dL   RDW 16.4 (H) 11.5 - 15.5 %   Platelets 139 (L) 150 - 400 K/uL   nRBC 0.0 0.0 - 0.2 %   Neutrophils Relative % 67 %   Neutro Abs 3.1 1.7 - 7.7 K/uL   Lymphocytes Relative 21 %   Lymphs Abs 0.9 0.7 - 4.0 K/uL   Monocytes Relative 9 %   Monocytes Absolute 0.4 0.1 - 1.0 K/uL   Eosinophils Relative 2 %   Eosinophils Absolute 0.1 0.0 - 0.5 K/uL   Basophils Relative 0 %   Basophils Absolute 0.0 0.0 - 0.1 K/uL   Immature Granulocytes 1 %   Abs Immature Granulocytes 0.03 0.00 - 0.07 K/uL    Comment: Performed at [facility], [address] [place], [place] [address]  Ethanol     Status: None   Collection Time: 08/28/18  8:46 PM  Result Value Ref Range   Alcohol, Ethyl (B) <10 <10 mg/dL    Comment: (NOTE) Lowest detectable limit for serum alcohol is 10 mg/dL. For medical purposes only. Performed at [facility], [address] [place], [place] [address]   Lipase, blood     Status: None   Collection Time: 08/28/18  8:46 PM  Result Value Ref Range   Lipase 27 11 - 51 U/L    Comment: Performed at [facility], [address] [place], [place] [address]  Salicylate level     Status: None   Collection Time: 08/28/18  8:46 PM  Result Value Ref Range   Salicylate Lvl <1.6 2.8 - 30.0 mg/dL    Comment: Performed at [facility], [address] [place], [place] [address]  Troponin I     Status: None   Collection Time: 08/28/18  8:46 PM  Result Value Ref Range   Troponin I <0.03 <0.03 ng/mL    Comment: Performed at [facility], [address] [place], [place] [address]  Acetaminophen level     Status: Abnormal   Collection Time:  08/28/18  8:46 PM  Result Value Ref Range   Acetaminophen (Tylenol), Serum <10 (L) 10 - 30 ug/mL    Comment: (NOTE) Therapeutic concentrations vary significantly. A range of 10-30 ug/mL  may be an effective concentration for many patients. However, some  are best treated at concentrations outside of this range. Acetaminophen concentrations >150 ug/mL at 4 hours after ingestion  and >50 ug/mL at 12 hours after ingestion are often associated with  toxic reactions. Performed at [facility], [address] [place], [place] [address]   Urinalysis, Complete w Microscopic     Status: Abnormal   Collection Time: 08/28/18  8:46 PM  Result Value Ref Range   Color, Urine YELLOW (A) YELLOW   APPearance CLEAR (A) CLEAR   Specific Gravity, Urine 1.011 1.005 - 1.030   pH 6.0 5.0 - 8.0   Glucose, UA NEGATIVE NEGATIVE mg/dL   Hgb urine dipstick NEGATIVE NEGATIVE   Bilirubin Urine NEGATIVE NEGATIVE   Ketones, ur NEGATIVE NEGATIVE mg/dL   Protein, ur NEGATIVE NEGATIVE mg/dL   Nitrite NEGATIVE NEGATIVE   Leukocytes, UA NEGATIVE NEGATIVE   RBC / HPF 0-5 0 - 5 RBC/hpf   WBC, UA 0-5 0 - 5 WBC/hpf   Bacteria, UA NONE SEEN NONE SEEN   Squamous Epithelial / LPF NONE SEEN 0 - 5   Hyaline Casts, UA PRESENT  Comment: Performed at [facility], [address], [place], [place] [address]  Urine culture     Status: None   Collection Time: 08/28/18  8:46 PM  Result Value Ref Range   Specimen Description      URINE, RANDOM Performed at [facility], [address] [place], [place] [address]    Special Requests      NONE Performed at [facility], [address], [place], [place] [address]    Culture      NO GROWTH Performed at [facility], [place] [address] [place], [place] [address]    Report Status 08/30/2018 FINAL     Current Facility-Administered Medications  Medication Dose Route Frequency Provider Last Rate Last Dose  . haloperidol lactate  (HALDOL) injection 2 mg  2 mg Intramuscular Q6H PRN Clapacs, John T, MD      . risperiDONE (RISPERDAL M-TABS) disintegrating tablet 0.5 mg  0.5 mg Oral BID Clapacs, John T, MD      . rivaroxaban (XARELTO) tablet 20 mg  20 mg Oral Daily Clapacs, Madie Reno, MD       Current Outpatient Medications  Medication Sig Dispense Refill  . divalproex (DEPAKOTE SPRINKLE) 125 MG capsule Take 250 mg by mouth 3 (three) times daily.    Marland Kitchen donepezil (ARICEPT) 10 MG tablet Take 10 mg by mouth at bedtime.  2  . LORazepam (ATIVAN) 0.5 MG tablet Take 0.5 mg by mouth every 8 (eight) hours as needed for anxiety.    . memantine (NAMENDA) 10 MG tablet Take 10 mg by mouth 2 (two) times daily.    . Multiple Vitamin (MULTIVITAMIN WITH MINERALS) TABS tablet Take 1 tablet by mouth daily.    Marland Kitchen neomycin-bacitracin-polymyxin (NEOSPORIN) [phone] ointment Apply 1 application topically daily. Apply to left wrist topically one time a day every Monday, Wednesday, Friday, Sunday for skin tear until heal.    . QUEtiapine (SEROQUEL) 100 MG tablet Take 100 mg by mouth 2 (two) times daily.    . traZODone (DESYREL) 50 MG tablet Take 50 mg by mouth at bedtime. May take every 12 hours as needed for agitation    . XARELTO 20 MG TABS tablet Take 20 mg by mouth daily.  5  . QUEtiapine (SEROQUEL) 25 MG tablet Take 2 tabs, 50 mg, in the evening, and 1 tab, 25 mg, in the morning. (Patient not taking: Reported on 08/29/2018) 50 tablet 0  . vitamin B-12 (CYANOCOBALAMIN) 1000 MCG tablet Take 1,000 mcg by mouth daily at 2 PM.      Musculoskeletal: Strength & Muscle Tone: within normal limits Gait & Station: normal Patient leans: N/A  Psychiatric Specialty Exam: Physical Exam  Nursing note and vitals reviewed. Constitutional: He appears well-developed and well-nourished.  HENT:  Head: Normocephalic and atraumatic.  Eyes: Pupils are equal, round, and reactive to light. Conjunctivae are normal.  Neck: Normal range of motion.  Cardiovascular:  Regular rhythm and normal heart sounds.  Respiratory: Effort normal. No respiratory distress.  GI: Soft.  Musculoskeletal: Normal range of motion.  Neurological: He is alert.  Skin: Skin is warm and dry.  Psychiatric: His affect is blunt. His speech is delayed. He is not agitated and not aggressive. Cognition and memory are impaired. He expresses impulsivity. He expresses no homicidal and no suicidal ideation. He exhibits abnormal recent memory and abnormal remote memory.    Review of Systems  Constitutional: Negative.   HENT: Negative.   Eyes: Negative.   Respiratory: Negative.  Cardiovascular: Negative.   Gastrointestinal: Negative.   Musculoskeletal: Negative.   Skin: Negative.   Neurological: Negative.   Psychiatric/Behavioral: Negative.     Blood pressure 137/68, pulse 74, temperature 98.5 F (36.9 C), temperature source Oral, resp. rate 16, SpO2 100 %.There is no height or weight on file to calculate BMI.  General Appearance: Casual  Eye Contact:  Minimal  Speech:  Slow  Volume:  Decreased  Mood:  Euthymic  Affect:  Constricted  Thought Process:  Disorganized and Irrelevant  Orientation:  Negative  Thought Content:  Illogical  Suicidal Thoughts:  No  Homicidal Thoughts:  No  Memory:  Immediate;   Fair Recent;   Poor Remote;   Poor  Judgement:  Impaired  Insight:  Lacking  Psychomotor Activity:  Decreased  Concentration:  Concentration: Poor  Recall:  Poor  Fund of Knowledge:  Poor  Language:  Poor  Akathisia:  Negative  Handed:  Right  AIMS (if indicated):     Assets:  Financial Resources/Insurance Social Support  ADL's:  Impaired  Cognition:  Impaired,  Moderate  Sleep:        Treatment Plan Summary: Daily contact with patient to assess and evaluate symptoms and progress in treatment, Medication management and Plan On interview today the patient is clearly quite demented and while he was not agitated or threatening he seems to have little or no cognitive  resources to allow him to cope with his current situation.  He has clearly shown repeated outbursts of violent agitation and he is a otherwise healthy adult man in whom aggression is a real concern.  Attempts have been made to manage this with Seroquel and medicines for dementia as an outpatient without sustained improvement.  Having evaluated the patient today I can start him back on his usual anticoagulant medicine.  I am also going to try replacing the Seroquel with modest doses of risk Toradol which may be less anticholinergic and better tolerated.  Also will continue as needed Haldol if needed.  I think a better option would be referral to geriatric psychiatry and will recommend patient be continued under IVC and referred to geriatric psychiatry.  I will refer this to TTS.  Case reviewed with social work.  Disposition: Recommend psychiatric Inpatient admission when medically cleared. Supportive therapy provided about ongoing stressors.  Alethia Berthold, MD 08/30/2018 10:50 AM

## 2018-08-30 NOTE — ED Notes (Addendum)
Pt needing redirection to exit the bathroom.  PO Haldol administered as ordered for increasing agitation.  Maintained on 15 minute checks and observation by security camera for safety.   1:1 sitter present

## 2018-08-30 NOTE — ED Notes (Signed)
IVC/  PENDING  PLACEMENT 

## 2018-08-30 NOTE — ED Notes (Addendum)
Hourly rounding reveals patient in room. No complaints, stable, in no acute distress. Pt has sitter and monitoring via Psychologist, counselling to continue.

## 2018-08-30 NOTE — Progress Notes (Addendum)
Dr. Toni Amend is recommending inpatient geri-psych for patient. Calvin with TTS is aware of above and will work on [employer]. Clinical Child psychotherapist (CSW) contacted patient's son [employer] however he did not answer and a voicemail was left. CSW also received a call from Elmore Guise associate executive director and made him aware of above. Per Reuel Boom if patient goes to geri-psych and is on medication that can control his behaviors they will consider taking patient back to [facility]. Per Reuel Boom he will call patient's sons and make them aware of above.   Patient's son [employer] called CSW back and was made aware of above.   [employer], LCSW [phone]

## 2018-08-30 NOTE — ED Notes (Signed)
Hourly rounding reveals patient in room with sitter. No complaints, stable, in no acute distress. Q15 minute rounds and monitoring via Rover and Officer to continue.  

## 2018-08-30 NOTE — ED Notes (Signed)
Nursing 1:1 note D:Pt observed eating his snack in bed. Respirations even and unlabored. No distress noted. A: 1:1 observation continues for safety  R: pt remains safe

## 2018-08-30 NOTE — Consult Note (Signed)
  Psychiatry: Chart reviewed.  Most recent social work note reviewed.  As of yesterday my understanding of the new policy in the emergency room is that I, as the psychiatrist, am not to evaluate psychiatry patients directly but only to rely on the TTS evaluation for decision making.  Patient being psychiatrically "clear" is only based on my understanding that TTS had declared the patient to be "social work only".  On review of the chart however I see no written TTS evaluation.  Also, I would point out that as currently documented in Amion, I should be reached on my cell phone rather than my pager as I have lost my pager and no replacement is available.  It is my understanding that once a patient has been in the emergency room for over a day without a disposition I am allowed to round on them so I will reassess the patient today.

## 2018-08-30 NOTE — ED Notes (Addendum)
Pt walking into the hall, wanting to go home. Needing re-direction from nurse tech and security.   Maintained on 15 minute checks and observation by security camera for safety.  1:1 sitter present

## 2018-08-30 NOTE — ED Notes (Signed)
Writer has wrapped patient forearms (bilaterally) due to multiple skin tears of varying sizes, with Tegaderm film and patient continues to tear it off.  At this time patient is not picking at his arms, patient will not pick/bother his arms when staff is looking.

## 2018-08-30 NOTE — BH Assessment (Signed)
Referral information for Psychiatric Hospitalization faxed to;   [facility] [phone]),   . [facility] [phone]),  . [facility] [phone], [phone], [phone] or [phone]),   . [employer] [facility] [phone])  . [facility] [phone]),   . [facility] [phone] or [phone])  . [facility] [phone] or [phone]),   . [facility] [phone]).

## 2018-08-30 NOTE — ED Notes (Signed)
Dr. Clapacs in room with patient 

## 2018-08-30 NOTE — Progress Notes (Signed)
Clinical Child psychotherapist (CSW) received a call from patient's son [name], he goes by [name]. Per [name] he lives in [place] and him and his brother [name] share HPOA for patient. Per [name] he spoke with [facility] and they stated they will not be able to accept him back and told him that patient needs geri-psych for medication adjustment. CSW explained to son that psych MD has cleared patient and he is ready for discharge. Per son he is working with [facility] private duty caregivers to arrange 24/7 care in the patient's condo ([address] [place], [place] [address]). Per son he will need 24 to 48 hours to move all of patient's furniture from [facility] back into his condo and arrange 24/7 caregivers. CSW made son aware that the sooner he can make these arrangements the better. [name] requested to speak to Dr. Toni Amend about certain medications. CSW paged Dr. Toni Amend. CSW also left a voicemail for [employer]. CSW will continue to follow and assist as needed.   [employer], LCSW [phone]

## 2018-08-30 NOTE — ED Notes (Signed)
Report to include Situation, Background, Assessment, and Recommendations received from RN Amy. Patient disoriented to time place and person. He is warm and dry, in no acute distress. Patient will not answer concerning SI HI AVH. Patient has a Comptroller in room for safety. Patient/ sitter instructed to come to me with needs or concerns. Will continue hourly rounding.

## 2018-08-30 NOTE — ED Notes (Addendum)
Hourly rounding reveals patient in room sleeping. No complaints, stable, in no acute distress. Q15 minute with sitter and monitoring via Psychologist, counselling to continue.

## 2018-08-30 NOTE — ED Notes (Signed)
Hourly rounding reveals patient in room with a sitter. No complaints, stable, in no acute distress. Q15 minute rounds and monitoring via Psychologist, counselling to continue.

## 2018-08-30 NOTE — ED Notes (Signed)
Patient given Haldol 5mg  PO with applesauce due to his aggressive activity of attempting to get up from bed he throws his legs over the bed.  Pt. continues to need frequent redirection. Patient is appropriate and cooperative and responds well to redirection.   Patient has voided twice this morning with assistance

## 2018-08-30 NOTE — ED Notes (Signed)
Writer wrapped both of patients forearms in Vaseline gauze and stretch net per Dr. Sharma Covert. Patient has mild swelling, redness and drainage in right forearm and redness and drainage in left forearm.

## 2018-08-31 ENCOUNTER — Emergency Department: Payer: Medicare Other

## 2018-08-31 NOTE — ED Notes (Signed)
Pt cooperative with EKG and chest x-ray.   Maintained on 15 minute checks and observation by security camera for safety.  1:1 sitter present

## 2018-08-31 NOTE — BH Assessment (Addendum)
TTS received a call from Gust Rung who reports pt is on waitlist - no beds available.  Intake RN requesting updated chest x-ray, EKG, and toxicology.  Pt's nurse (Amy B.) has been made aware of this request.

## 2018-08-31 NOTE — ED Provider Notes (Signed)
CXR neg for acute disease.  Patient's EKG is reviewed by me at 1605 Heart rate 110 QRS 110 QTc 460 Atrial fibrillation, slight rapid ventricular response without ischemic abnormality.  Patient does have a previous history of A. fib and is currently on anticoagulation.  This does not appear to be new or acute change.  Continue to monitor vital signs, seems to be paroxysmally in and out of A. Fib.  Not prior noted to be on any rate controlling meds. Will continue to monitor.   Sharyn Creamer, MD 08/31/18 1705

## 2018-08-31 NOTE — ED Notes (Signed)
Hourly rounding reveals patient in room with sitter. No complaints, stable, in no acute distress. Q15 minute rounds and monitoring via Rover and Officer to continue.  

## 2018-08-31 NOTE — ED Notes (Signed)
Pt. Laying on bed watching [employer] channel.  Pt. Is calm and cooperative at this time.

## 2018-08-31 NOTE — ED Notes (Addendum)
Patient asleep in room. No noted distress or abnormal behavior. Will continue 15 minute checks and observation by security cameras for safety.  1:1 sitter present.  

## 2018-08-31 NOTE — ED Provider Notes (Signed)
-----------------------------------------   3:38 PM on 08/31/2018 -----------------------------------------  Patient in no acute distress, the facility to which we are trying to send him requested that he have chest x-ray EKG and "tox screen" hard to know what were looking for for nursing home patient but we will send a tox screen in his urine if we can obtain a, chest x-ray and EKG, he is already had a urinalysis.  This w/u was signed out at the end of my shift to Dr. Lorelle Gibbs, Rudy Jew, MD 08/31/18 1539

## 2018-08-31 NOTE — Progress Notes (Signed)
LCSW received call from both sons of patient and explained patient information has been sent out by TTS and they will contact them once patient has been placed in a geri-pych facility. Chip [facility]er has agreed to take patient back once he is stabilized.  Called TTS and they verified information was faxed out and she would call family once bed offered made.  No further SW needs.

## 2018-08-31 NOTE — ED Notes (Signed)
Hourly rounding reveals patient in room. No complaints, stable, in no acute distress. Q15 minute rounds and monitoring via Rover and Officer to continue.   

## 2018-08-31 NOTE — ED Provider Notes (Signed)
-----------------------------------------   6:48 AM on 08/31/2018 -----------------------------------------   Blood pressure 134/73, pulse 67, temperature 98.1 F (36.7 C), temperature source Oral, resp. rate 18, SpO2 100 %.  The patient had no acute events since last update.  Calm and cooperative at this time.  Disposition is pending social work team recommendations.     Irean Hong, MD 08/31/18 [phone]

## 2018-08-31 NOTE — ED Notes (Addendum)
Patient younger brother and brothers wife in lobby wanting information on patient.  Brother states, pt. Had been living by self a couple weeks ago before going to [facility].  Brother given visiting and calling times.

## 2018-09-01 NOTE — ED Notes (Signed)
BEHAVIORAL HEALTH ROUNDING Patient sleeping: No. Patient alert : yes Behavior appropriate: Yes.  ; If no, describe:  Nutrition and fluids offered: yes Toileting and hygiene offered: Yes  Sitter present: q15 minute observations and security monitoring Law enforcement present: Yes  ODS  

## 2018-09-01 NOTE — ED Notes (Signed)
In to speak with pt. Pt denies pain or discomfort. Offered food and drink to the pt. Pt states he wants a cola. Pt given a cola with a straw as he needs to use on his own. Sitter at bedside to supervise.

## 2018-09-01 NOTE — ED Notes (Addendum)
Brother of pt visited with pt and expressed concern that he thought pt sounds congested and was coughing during the visit. Tech informed brother that I would pass the message on to RN.  Pt ate, but had to be encouraged and tech had to ask pt to take a bite every time the pt took a bite. Pt ate approx. 50% of supper tray and started spitting out the last few bites. Pt was given sprite to drink. Pt appears to be more lethargic than two days ago when this tech was here. Brother left and this tech covered pt in two blankets and cut the light out. Pt is resting with eyes closed, but is awake and is continuing to cough. Pt was very fidgety while brother was visiting.

## 2018-09-01 NOTE — ED Notes (Signed)
Pt. Indicated he needed to use bathroom.  Assisted pt. To bathroom, pt. Needed minimal support.

## 2018-09-01 NOTE — ED Notes (Signed)
In to see pt. Pt attempting to get out of the bed. This RN assisted pt out of the bed and walk to the bathroom. Sitter with pt in the restroom.

## 2018-09-01 NOTE — ED Notes (Addendum)
BEHAVIORAL HEALTH ROUNDING Patient sleeping: No. Patient alert : yes Behavior appropriate: Yes.  ; If no, describe:  Nutrition and fluids offered: yes Toileting and hygiene offered: Yes  Sitter present: q15 minute observations and security monitoring Law enforcement present: Yes  ODS  

## 2018-09-01 NOTE — ED Notes (Signed)
BEHAVIORAL HEALTH ROUNDING Patient sleeping: No. - lying in bed with his eyes closed - talking at times  Patient alert and oriented:  Disoriented - dementia history  Behavior appropriate: Yes.  ; If no, describe:  Nutrition and fluids offered: yes Toileting and hygiene offered: Yes  Sitter present: q15 minute observations and security monitoring Law enforcement present: Yes  ODS  ENVIRONMENTAL ASSESSMENT Potentially harmful objects out of patient reach: Yes.   Personal belongings secured: Yes.   Patient dressed in hospital provided attire only: Yes.   Plastic bags out of patient reach: Yes.   Patient care equipment (cords, cables, call bells, lines, and drains) shortened, removed, or accounted for: Yes.   Equipment and supplies removed from bottom of stretcher: Yes.   Potentially toxic materials out of patient reach: Yes.   Sharps container removed or out of patient reach: Yes.

## 2018-09-01 NOTE — ED Notes (Signed)
In to give pt medication which he took without difficulty. This RN notes a junky congestive cough prior to med administration. Pt readjusted in bed and took a small sip of water with no signs of choking. Chest xray done yesterday. Will notify oncoming MD.

## 2018-09-01 NOTE — ED Notes (Signed)
BEHAVIORAL HEALTH ROUNDING Patient sleeping: No. Patient alert and oriented:  Alert - oriented to self  Behavior appropriate: Yes.  ; If no, describe:  Nutrition and fluids offered: yes Toileting and hygiene offered: Yes  Sitter present: q15 minute observations and security monitoring Law enforcement present: Yes  ODS  

## 2018-09-01 NOTE — ED Notes (Signed)
Patient observed lying in bed with eyes closed  Even, unlabored respirations observed   NAD pt appears to be sleeping  1:1 sitter is at bedside for safety  I will continue to monitor along with every 15 minute visual observations and ongoing security monitoring

## 2018-09-01 NOTE — BH Assessment (Addendum)
Referral information for Psychiatric Hospitalization faxed to;    [facility] ([phone]) - Per Asher Muir, pt is declined due to medical reasons.   [facility] [phone]) - RE-FAXED   [facility] [phone], [phone] or [phone]) - waitlist; no male geri-psych beds available.   [address] [phone])   [facility] [phone]) - no answer   [facility] [phone] or [phone]) - Per Nehemiah Settle, unable to locate information - RE-FAXED   [facility] [phone] or [phone]) - waitlist; no male beds available.    [facility] [phone]) - Per Meg, unable to locate information - RE-FAXED

## 2018-09-01 NOTE — ED Notes (Signed)
ED Is the patient under IVC or is there intent for IVC: Yes.   Is the patient medically cleared: Yes.   Is there vacancy in the ED BHU: Yes.   Is the population mix appropriate for patient:  No  Geriatric  - memory care  Safety sitter is in place  Is the patient awaiting placement in inpatient or outpatient setting: Yes.   Has the patient had a psychiatric consult: Yes.   Survey of unit performed for contraband, proper placement and condition of furniture, tampering with fixtures in bathroom, shower, and each patient room: Yes.  ; Findings:  APPEARANCE/BEHAVIOR Calm and cooperative NEURO ASSESSMENT Orientation: oriented to self   Denies pain Hallucinations: No.None noted (Hallucinations) Speech: Normal  Slow to respond at times  Gait:  Unsteady at times  RESPIRATORY ASSESSMENT Even  Unlabored respirations  CARDIOVASCULAR ASSESSMENT Pulses equal   regular rate  Skin warm and dry   GASTROINTESTINAL ASSESSMENT no GI complaint EXTREMITIES Full ROM  PLAN OF CARE Provide calm/safe environment. Vital signs assessed twice daily. ED BHU Assessment once each 12-hour shift. Collaborate with TTS daily or as condition indicates. Assure the ED provider has rounded once each shift. Provide and encourage hygiene. Provide redirection as needed. Assess for escalating behavior; address immediately and inform ED provider.  Assess family dynamic and appropriateness for visitation as needed: Yes.  ; If necessary, describe findings:  Educate the patient/family about BHU procedures/visitation: Yes.  ; If necessary, describe findings:

## 2018-09-01 NOTE — ED Notes (Addendum)
BEHAVIORAL HEALTH ROUNDING Patient sleeping: No. Patient alert and oriented:   Disoriented  Behavior appropriate: Yes.  ; If no, describe:  Nutrition and fluids offered: yes Toileting and hygiene offered: Yes  Sitter present: q15 minute observations and security monitoring Law enforcement present: Yes  ODS

## 2018-09-01 NOTE — ED Notes (Signed)
Changed clear bandage on lt. Arm covering skin tears.  Pt. Had picked the old nearly off.  Pt. Advised to not pick at new one.

## 2018-09-01 NOTE — Clinical Social Work Note (Signed)
CSW received a phone call from patient's son [name] asking for an update on placement for geripsych for patient, CSW informed him that patient does not have any beds yet and is on the waitlist for a couple of the facilities.  [name]. [name], MSW, LCSWA 09/01/2018 4:19 PM

## 2018-09-01 NOTE — BH Assessment (Addendum)
Pt is being reviewed by [facility] for geri-psych inpatient.  - on waitlist; no male geri-psych beds available (per Darlene)

## 2018-09-01 NOTE — ED Notes (Signed)
Pt sitting up in bed eating breakfast - country music playing on the TV   NAD assessed  He continues to await inpt geriatric hospitalization placement    He denies pain  Bilateral ecchymosis noted to arms  - left arm covered by tegaderm  Continue to monitor

## 2018-09-01 NOTE — ED Notes (Addendum)
Pt given supper tray. Pt appears sleepy and somewhat lethargic and is not eating at this time.

## 2018-09-01 NOTE — BH Assessment (Signed)
Pt information re-faxed to:   [facility] [phone], [phone], [phone] or [phone]),

## 2018-09-01 NOTE — ED Notes (Signed)
Pt with a visit from his brother  - observed by 1:1 sitter

## 2018-09-02 ENCOUNTER — Emergency Department: Payer: Medicare Other

## 2018-09-02 LAB — BLOOD GAS, VENOUS
ACID-BASE EXCESS: 0 mmol/L (ref 0.0–2.0)
Bicarbonate: 24.8 mmol/L (ref 20.0–28.0)
FIO2: 0.21
O2 SAT: 67.3 %
Patient temperature: 37
pCO2, Ven: 40 mmHg — ABNORMAL LOW (ref 44.0–60.0)
pH, Ven: 7.4 (ref 7.250–7.430)
pO2, Ven: 35 mmHg (ref 32.0–45.0)

## 2018-09-02 LAB — CBC
HEMATOCRIT: 47.8 % (ref 39.0–52.0)
Hemoglobin: 15.2 g/dL (ref 13.0–17.0)
MCH: 27.8 pg (ref 26.0–34.0)
MCHC: 31.8 g/dL (ref 30.0–36.0)
MCV: 87.4 fL (ref 80.0–100.0)
Platelets: 151 10*3/uL (ref 150–400)
RBC: 5.47 MIL/uL (ref 4.22–5.81)
RDW: 16.7 % — ABNORMAL HIGH (ref 11.5–15.5)
WBC: 8.4 10*3/uL (ref 4.0–10.5)
nRBC: 0 % (ref 0.0–0.2)

## 2018-09-02 LAB — LACTIC ACID, PLASMA: LACTIC ACID, VENOUS: 1.5 mmol/L (ref 0.5–1.9)

## 2018-09-02 LAB — CREATININE, SERUM
Creatinine, Ser: 1.01 mg/dL (ref 0.61–1.24)
GFR calc non Af Amer: >60 mL/min (ref >60)

## 2018-09-02 LAB — GLUCOSE, CAPILLARY: Glucose-Capillary: 132 mg/dL — ABNORMAL HIGH (ref 70–99)

## 2018-09-02 MED ORDER — SODIUM CHLORIDE 0.9 % IV BOLUS
1000.0000 mL | Freq: Once | INTRAVENOUS | Status: AC
  Administered 2018-09-02: 1000 mL via INTRAVENOUS

## 2018-09-02 MED ORDER — IPRATROPIUM-ALBUTEROL 0.5-2.5 (3) MG/3ML IN SOLN
3.0000 mL | Freq: Once | RESPIRATORY_TRACT | Status: AC
  Administered 2018-09-02: 3 mL via RESPIRATORY_TRACT
  Filled 2018-09-02: qty 3

## 2018-09-02 NOTE — ED Notes (Signed)
Pt resting comfortably with open, no distress noted at this time. Sitter at bedside, awaiting urine specimen.

## 2018-09-02 NOTE — ED Notes (Signed)
BEHAVIORAL HEALTH ROUNDING Patient sleeping: No. Patient alert : yes Behavior appropriate: Yes.  ; If no, describe:  Nutrition and fluids offered: yes Toileting and hygiene offered: Yes  Sitter present: q15 minute observations and security monitoring Law enforcement present: Yes  ODS  

## 2018-09-02 NOTE — ED Notes (Signed)
Report received from Sherie  RN. Patient care assumed. Patient/RN introduction complete. Will continue to monitor.  

## 2018-09-02 NOTE — ED Notes (Signed)
No change in condition.

## 2018-09-02 NOTE — ED Notes (Signed)
Pt to CT scan.

## 2018-09-02 NOTE — ED Notes (Signed)
BEHAVIORAL HEALTH ROUNDING Patient sleeping: Yes.   Patient alert and oriented: eyes closed  Appears to be asleep Behavior appropriate: Yes.  ; If no, describe:  Nutrition and fluids offered: Yes  Toileting and hygiene offered: sleeping Sitter present: q 15 minute observations and security monitoring Law enforcement present: yes  ODS 

## 2018-09-02 NOTE — ED Notes (Addendum)
Bed bath and linen change completed  Pt lethargic    Lab specimens collected and sent to lab  Continue to monitor

## 2018-09-02 NOTE — ED Notes (Signed)
Pt with productive cough and congestion, EDP made aware awaiting cxr.

## 2018-09-02 NOTE — ED Notes (Signed)
Pt with a visit from his son   

## 2018-09-02 NOTE — ED Provider Notes (Signed)
There is supplementation patient is persistently drowsy started to have soft blood pressures.  Patient has not gotten up all day.  Also reporting cough and congestion.  Patient very difficult to awake.  Not following commands.  Did receive Risperdal this morning.  Will order CT head as well as check CBG, repeat blood work give IV fluid bolus nebulizer and check VBG.   Willy Eddy, MD 09/02/18 1340

## 2018-09-02 NOTE — ED Provider Notes (Signed)
-----------------------------------------   6:16 AM on 09/02/2018 -----------------------------------------   Blood pressure 140/72, pulse 76, temperature 97.9 F (36.6 C), temperature source Oral, resp. rate 17, weight 80.7 kg, SpO2 99 %.  The patient had no acute events since last update.  Calm and cooperative at this time.  Disposition is pending geripsych placement.    Merrily Brittle, MD 09/02/18 [phone]

## 2018-09-02 NOTE — ED Notes (Signed)

## 2018-09-02 NOTE — ED Notes (Signed)
[name]  [phone]  Please contact with update or changes

## 2018-09-02 NOTE — ED Notes (Signed)
Pt resting quietly.  No distress noted.  

## 2018-09-02 NOTE — ED Notes (Signed)
BEHAVIORAL HEALTH ROUNDING Patient sleeping: No. Patient alert and oriented: yes Behavior appropriate: Yes.  ; If no, describe:  Nutrition and fluids offered: yes Toileting and hygiene offered: Yes  Sitter present: q15 minute observations and security  monitoring Law enforcement present: Yes  ODS  

## 2018-09-02 NOTE — ED Notes (Signed)
ED  Is the patient under IVC or is there intent for IVC: Yes.   Is the patient medically cleared: Yes.   Is there vacancy in the ED BHU: Yes.   Is the population mix appropriate for patient:  Is the patient awaiting placement in inpatient or outpatient setting: Yes.  geripsych placement   Has the patient had a psychiatric consult: Yes.   Survey of unit performed for contraband, proper placement and condition of furniture, tampering with fixtures in bathroom, shower, and each patient room: Yes.  ; Findings:  APPEARANCE/BEHAVIOR  cooperative NEURO ASSESSMENT Orientation: disoriented    Hallucinations: No.None noted (Hallucinations) Speech: Normal - slow to respond  Gait: normal stand by assistance  RESPIRATORY ASSESSMENT Even , shallow,  Unlabored respirations   CARDIOVASCULAR ASSESSMENT Pulses equal   regular rate  Skin warm and dry   GASTROINTESTINAL ASSESSMENT no GI complaint EXTREMITIES Full ROM  PLAN OF CARE Provide calm/safe environment. Vital signs assessed twice daily. ED BHU Assessment once each 12-hour shift. Collaborate with TTS daily or as condition indicates. Assure the ED provider has rounded once each shift. Provide and encourage hygiene. Provide redirection as needed. Assess for escalating behavior; address immediately and inform ED provider.  Assess family dynamic and appropriateness for visitation as needed: Yes.  ; If necessary, describe findings:  Educate the patient/family about BHU procedures/visitation: Yes.  ; If necessary, describe findings:

## 2018-09-02 NOTE — Progress Notes (Addendum)
There has been no bed offers at this time for [facility] psych beds  [facility] LCSW [phone]

## 2018-09-02 NOTE — ED Notes (Signed)
Patient observed lying in bed with eyes closed  Chest xray completed upon my arrival   Even, unlabored respirations observed   NAD pt appears to be sleeping  I will continue to monitor along with every 15 minute visual observations and ongoing security monitoring

## 2018-09-02 NOTE — ED Notes (Signed)
Pt. Sleeping in bed at this time.  Pt. Has not gotten out of bed today.  Vitals are stable.

## 2018-09-03 MED ORDER — VITAMIN B-1 100 MG PO TABS
100.0000 mg | ORAL_TABLET | Freq: Every day | ORAL | Status: DC
  Administered 2018-09-04 – 2018-09-07 (×4): 100 mg via ORAL
  Filled 2018-09-03 (×4): qty 1

## 2018-09-03 MED ORDER — FOLIC ACID 1 MG PO TABS
1.0000 mg | ORAL_TABLET | Freq: Every day | ORAL | Status: DC
  Administered 2018-09-04 – 2018-09-07 (×4): 1 mg via ORAL
  Filled 2018-09-03 (×4): qty 1

## 2018-09-03 NOTE — ED Notes (Addendum)
Patient ambulatory to bathroom with two person assist, patient has a decubitus forming on  "intergluteal cleft" and on the inside of right butt cheek. Dr. Luna Glasgow informed.   Allev[facility] Life Sacrum pink pad placed on patients rear

## 2018-09-03 NOTE — Progress Notes (Signed)
PT Cancellation Note  Patient Details Name: [name]. MRN: [id number] DOB: [birth year]   Cancelled Treatment:    Reason Eval/Treat Not Completed: Patient's level of consciousness.  Order received.  Chart reviewed.  Pt very lethargic and not responding or opening eyes to respond to PT.  Did not voluntarily move LE's and resisted movement when PT attempted to assist.  Per nursing, pt has been unresponsive this morning and requested the PT check back this afternoon.  Will re-attempt when pt is more appropriate.   Glenetta Hew, PT, DPT 09/03/2018, 10:03 AM

## 2018-09-03 NOTE — Progress Notes (Signed)
LCSW supported TTS and  called these facilities to see if there is any male bed availability:  Called [facility] and spoke to [facility] No male [facility] beds available until Monday and then there is a wait list.  [facility] and there was no answer   [employer] LCSW [phone]

## 2018-09-03 NOTE — Evaluation (Signed)
Physical Therapy Evaluation Patient Details Name: [name]. MRN: [id number] DOB: 09/15/1934 Today's Date: 09/03/2018   History of Present Illness  Patient is an 84 year old male brought to the ED after being combative with caregivers.  Pt has gradually developed signs of weakness and change in gait during his stay in the ED.  PMH includes PAF, chronic back pain and acute CVA.  Clinical Impression  Patient is an 84 year old male who was brought to the ED from [facility].  He is able to ambulate without use of AD at baseline. Pt slow to respond to PT upon arrival; more responsive to sitter.  He required +2 assist to sit at EOB, perform STS and ambulate to restroom (total ambulatory distance: 40 ft.).  Pt required to stand at toilet for prolonged period of time to void and to address sacral wound.  Pt demonstrated impulsive behavior, bending to lift objects from floor, etc.  He required mod-max A for all functional activity and presented with a L lateral lean throughout ambulation and static stance.  Pt left with nursing and MD at bedside to address sacral wound.  Pt will benefit from continued PT with focus on strength, balance, safe functional mobility and education for use of RW for fall prevention.  Due to pt not functioning at baseline, he is appropriate for the SNF setting at this time.     Follow Up Recommendations SNF    Equipment Recommendations  Rolling walker with 5" wheels(TBD at next venue of care.)    Recommendations for Other Services       Precautions / Restrictions Precautions Precautions: Fall Restrictions Weight Bearing Restrictions: No      Mobility  Bed Mobility Overal bed mobility: Needs Assistance Bed Mobility: Supine to Sit     Supine to sit: Mod assist     General bed mobility comments: 2 person assist to sit at EOB. Pt very lethargic and tending to lean to the L.  Transfers Overall transfer level: Needs assistance Equipment used: 2  person hand held assist Transfers: Sit to/from Stand Sit to Stand: Mod assist;+2 physical assistance         General transfer comment: Pt slow to stand with multiple VC's to stand upright.  Ambulation/Gait Ambulation/Gait assistance: Max assist Gait Distance (Feet): 40 Feet Assistive device: 2 person hand held assist Gait Pattern/deviations: Narrow base of support   Gait velocity interpretation: <1.8 ft/sec, indicate of risk for recurrent falls General Gait Details: VC's provided for pt to widen BOS, pt leaning to L side on PT throughout ambualation.  Pt with low foot clearance and decreased step length.  Stairs            Wheelchair Mobility    Modified Rankin (Stroke Patients Only)       Balance Overall balance assessment: Needs assistance Sitting-balance support: Bilateral upper extremity supported;Feet supported Sitting balance-Leahy Scale: Poor     Standing balance support: Bilateral upper extremity supported Standing balance-Leahy Scale: Poor                               Pertinent Vitals/Pain Pain Assessment: No/denies pain    Home Living Family/patient expects to be discharged to:: Assisted living               Home Equipment: None      Prior Function Level of Independence: Independent  Comments: Patient was independent with ambulation upon arriving to ED.     Hand Dominance        Extremity/Trunk Assessment   Upper Extremity Assessment Upper Extremity Assessment: Generalized weakness    Lower Extremity Assessment Lower Extremity Assessment: Generalized weakness    Cervical / Trunk Assessment Cervical / Trunk Assessment: Kyphotic  Communication   Communication: No difficulties  Cognition Arousal/Alertness: Lethargic Behavior During Therapy: Restless Overall Cognitive Status: No family/caregiver present to determine baseline cognitive functioning                                 General  Comments: Follows commands inconsistently. More responsive to sitter than PT and RN.      General Comments      Exercises Other Exercises Other Exercises: Assisted with toileting x10 min.   Assessment/Plan    PT Assessment Patient needs continued PT services  PT Problem List Decreased strength;Decreased activity tolerance;Decreased safety awareness;Decreased balance       PT Treatment Interventions DME instruction;Balance training;Gait training;Neuromuscular re-education;Cognitive remediation;Functional mobility training;Patient/family education;Therapeutic activities;Therapeutic exercise    PT Goals (Current goals can be found in the Care Plan section)  Acute Rehab PT Goals PT Goal Formulation: Patient unable to participate in goal setting    Frequency Min 2X/week   Barriers to discharge        Co-evaluation               AM-PAC PT "6 Clicks" Daily Activity  Outcome Measure Difficulty turning over in bed (including adjusting bedclothes, sheets and blankets)?: A Lot Difficulty moving from lying on back to sitting on the side of the bed? : A Lot Difficulty sitting down on and standing up from a chair with arms (e.g., wheelchair, bedside commode, etc,.)?: A Lot Help needed moving to and from a bed to chair (including a wheelchair)?: A Lot Help needed walking in hospital room?: A Lot Help needed climbing 3-5 steps with a railing? : A Lot 6 Click Score: 12    End of Session Equipment Utilized During Treatment: Gait belt Activity Tolerance: Patient limited by fatigue Patient left: in bed;with nursing/sitter in room Nurse Communication: Mobility status PT Visit Diagnosis: Unsteadiness on feet (R26.81);Other abnormalities of gait and mobility (R26.89)    Time: 1610-9604 PT Time Calculation (min) (ACUTE ONLY): 31 min   Charges:   PT Evaluation $PT Eval Low Complexity: 1 Low PT Treatments $Therapeutic Activity: 8-22 mins        Glenetta Hew, PT,  DPT   Glenetta Hew 09/03/2018, 5:45 PM

## 2018-09-03 NOTE — ED Notes (Signed)
Patients ex-wife continues to sot with patient, patient eyes are open, he is fidgeting with beard, patient continues to mumble to self

## 2018-09-03 NOTE — Progress Notes (Signed)
LCSW was asked to speak to [name] the Director of facility and left a message [phone]. LCSW will advocate to have patient returned to memory care unit if possible.  LCSW will review progress and ED RN and EDP notes to see if patient will be accepted.  Quiera Diffee LCSW

## 2018-09-03 NOTE — Progress Notes (Signed)
LCSW and TTS consulted. They are still actively looking for a Geri psych bed. LCSW received call from son and he was concerned that his father was not responding or awaking when he had constant visiting yesterday.  LCSW consulted with EDP about sons concerns and patient was now awake and alert and able to respond in words with nurse and health care aid and EDP.   PT was contacted and they will consult with patient after lunch today.  [employer] LCSW [phone]

## 2018-09-03 NOTE — ED Notes (Signed)
IVC/Pending Placement 

## 2018-09-03 NOTE — ED Notes (Signed)
Patient had a bowel movement 

## 2018-09-03 NOTE — ED Notes (Signed)
Pt drank one cup of grape juice

## 2018-09-03 NOTE — ED Notes (Signed)
Pt. Had bowel movement.  Pt. Needed assistance to be changed.  Pt. Has a stage 1-2 pressure ulcer on sacrum.  New pressure ulcer dressing and barrier cream applied.  Pt. Assisted to side while laying in bed and encouraged to change positions when possible.  MD notified.

## 2018-09-03 NOTE — ED Notes (Signed)
Hourly rounding reveals patient sleeping in room. No complaints, stable, in no acute distress. Q15 minute rounds and monitoring via Security to continue. 

## 2018-09-03 NOTE — ED Provider Notes (Signed)
-----------------------------------------   1:32 AM on 09/03/2018 -----------------------------------------   Blood pressure 125/64, pulse 71, temperature (!) 97.1 F (36.2 C), temperature source Axillary, resp. rate 16, weight 80.7 kg, SpO2 98 %.  The patient had no acute events since last update.  Sleeping.  Disposition is pending per Psychiatry/Behavioral Medicine team recommendations.     Nita Sickle, MD 09/03/18 [phone]

## 2018-09-03 NOTE — ED Notes (Signed)
Patient attempted to throw legs off the bed, officer informed Clinical research associate, Clinical research associate went into patients room and patient asked "do you see those beers on the wall", writer asked patient if he drank beer and patient said "hell no I don't drink beer" and then patient began mumbling under his breath and closed his eyes as if he was resting.

## 2018-09-03 NOTE — ED Notes (Signed)
Patient visiting with ex wife, patient is not responding to her talking to him, he has his eyes closed mumbling to himself

## 2018-09-03 NOTE — ED Notes (Signed)
Patient ambulating to bathroom with assistance, gait is a little unsteady. Patient is alert and cooperative, attempting to assist staff in putting his Depend back on

## 2018-09-03 NOTE — ED Notes (Signed)
Pt. Sleeping in bed at this time.   

## 2018-09-04 MED ORDER — HALOPERIDOL LACTATE 5 MG/ML IJ SOLN
5.0000 mg | Freq: Once | INTRAMUSCULAR | Status: AC
  Administered 2018-09-04: 5 mg via INTRAMUSCULAR
  Filled 2018-09-04: qty 1

## 2018-09-04 NOTE — Progress Notes (Signed)
Pt visibly agitated, getting out of his bed; refusing to get back in "I want to go play golf, I got to go now". Verbal redirections and encouragement offered but pt continues to escalate. Pt tore his diaper and chuck off, attempted to push staff out of his way to get out of his room. Will inform assigned EDP of current status and continue to monitor.

## 2018-09-04 NOTE — ED Notes (Signed)
Pt repositioned in bed.

## 2018-09-04 NOTE — Clinical Social Work Note (Signed)
CSW received message from patient's son Rhea [phone] and he said that [facility] was going to be meeting with patient and his son at 4:30 today to determine if [facility] would be a good fit for patient.  CSW advised him that PT worked with him yesterday, and are recommending SNF for short term rehab.  CSW offered to begin bed search, and also told him that insurance will have to approve patient for SNF, so most likely approval not going to happen today.  CSW left message awaiting for call back to see if son wants CSW to begin bed search.  Ervin Knack. Talina Pleitez, MSW, LCSWA [phone]  09/04/2018 11:04 AM

## 2018-09-04 NOTE — NC FL2 (Signed)
[place] MEDICAID FL2 LEVEL OF CARE SCREENING TOOL     IDENTIFICATION  Patient Name: Jader Desai. Birthdate: [birth year] Sex: male Admission Date (Current Location): 08/28/2018  [place] and [place] Number:  Chiropodist and Address:  [facility], [address] [place], [place] [address]      Provider Number: [id number]  Attending Physician Name and Address:  No att. providers found  Relative Name and Phone Number:  Bevin, Mayall   [phone]     Current Level of Care: Hospital Recommended Level of Care: Skilled Nursing Facility Prior Approval Number:    Date Approved/Denied:   PASRR Number: [id number]  Discharge Plan: SNF    Current Diagnoses: Patient Active Problem List   Diagnosis Date Noted  . Dementia, multiinfarct, with behavioral disturbance (HCC) 07/08/2018    Orientation RESPIRATION BLADDER Height & Weight     Self  Normal Continent Weight: 178 lb (80.7 kg) Height:     BEHAVIORAL SYMPTOMS/MOOD NEUROLOGICAL BOWEL NUTRITION STATUS      Continent Diet(Regular diet)  AMBULATORY STATUS COMMUNICATION OF NEEDS Skin   Limited Assist Verbally Normal                       Personal Care Assistance Level of Assistance  Bathing, Feeding, Dressing, Total care Bathing Assistance: Limited assistance Feeding assistance: Independent Dressing Assistance: Limited assistance Total Care Assistance: Limited assistance   Functional Limitations Info  Sight, Hearing, Speech Sight Info: Adequate Hearing Info: Impaired Speech Info: Adequate    SPECIAL CARE FACTORS FREQUENCY  PT (By licensed PT)     PT Frequency: 5x a week              Contractures      Additional Factors Info  Code Status, Allergies, Psychotropic Code Status Info: Full Code Allergies Info: nka Psychotropic Info: risperiDONE (RISPERDAL M-TABS) disintegrating tablet 0.5 mg          Current Medications (09/04/2018):  This is the  current hospital active medication list Current Facility-Administered Medications  Medication Dose Route Frequency Provider Last Rate Last Dose  . folic acid (FOLVITE) tablet 1 mg  1 mg Oral Daily Sharman Cheek, MD   1 mg at 09/04/18 1042  . haloperidol lactate (HALDOL) injection 2 mg  2 mg Intramuscular Q6H PRN Clapacs, John T, MD      . risperiDONE (RISPERDAL M-TABS) disintegrating tablet 0.5 mg  0.5 mg Oral BID Clapacs, John T, MD   0.5 mg at 09/04/18 1042  . rivaroxaban (XARELTO) tablet 20 mg  20 mg Oral Daily Clapacs, Jackquline Denmark, MD   20 mg at 09/04/18 1042  . thiamine (VITAMIN B-1) tablet 100 mg  100 mg Oral Daily Sharman Cheek, MD   100 mg at 09/04/18 1042   Current Outpatient Medications  Medication Sig Dispense Refill  . divalproex (DEPAKOTE SPRINKLE) 125 MG capsule Take 250 mg by mouth 3 (three) times daily.    Marland Kitchen donepezil (ARICEPT) 10 MG tablet Take 10 mg by mouth at bedtime.  2  . LORazepam (ATIVAN) 0.5 MG tablet Take 0.5 mg by mouth every 8 (eight) hours as needed for anxiety.    . memantine (NAMENDA) 10 MG tablet Take 10 mg by mouth 2 (two) times daily.    . Multiple Vitamin (MULTIVITAMIN WITH MINERALS) TABS tablet Take 1 tablet by mouth daily.    Marland Kitchen neomycin-bacitracin-polymyxin (NEOSPORIN) [phone] ointment Apply 1 application topically daily. Apply to left wrist topically one  time a day every Monday, Wednesday, Friday, Sunday for skin tear until heal.    . QUEtiapine (SEROQUEL) 100 MG tablet Take 100 mg by mouth 2 (two) times daily.    . traZODone (DESYREL) 50 MG tablet Take 50 mg by mouth at bedtime. May take every 12 hours as needed for agitation    . XARELTO 20 MG TABS tablet Take 20 mg by mouth daily.  5  . QUEtiapine (SEROQUEL) 25 MG tablet Take 2 tabs, 50 mg, in the evening, and 1 tab, 25 mg, in the morning. (Patient not taking: Reported on 08/29/2018) 50 tablet 0  . vitamin B-12 (CYANOCOBALAMIN) 1000 MCG tablet Take 1,000 mcg by mouth daily at 2 PM.        Discharge Medications: Please see discharge summary for a list of discharge medications.  Relevant Imaging Results:  Relevant Lab Results:   Additional Information SSN [id number]  Darleene Cleaver, [place]

## 2018-09-04 NOTE — ED Notes (Signed)
Tech, RN and pt son assisted pt with standing to use urinal. Pt tried but was unable to do so. Pt diaper was dry. Pt placed back in bed, covered and bed adjusted for comfort. Pt refused anymore food, but drank a cup of sprite and a carton of orange juice.

## 2018-09-04 NOTE — ED Notes (Signed)
Pt expressing urge to urinate. This tech assisted pt with use of urinal and pt did not urinate.

## 2018-09-04 NOTE — ED Provider Notes (Signed)
Called by nurse to evaluate the patient has he has become agitated.  He is not really redirectable.  He has had food on the floor.  We will try Haldol to see if it helps with his agitation.     Governor Rooks, MD 09/04/18 [phone]

## 2018-09-04 NOTE — ED Notes (Signed)
IVC renewed  

## 2018-09-04 NOTE — Clinical Social Work Note (Signed)
CSW received message back from patient's son, and he would like CSW to begin SNF bed search in [place] and [place], CSW faxed required clinical information to SNFs.  Patient will need authorization for SNF, if patient and family agrees to SNF.  [name]. Hassan Rowan, MSW, Theresia Majors [phone]  09/04/2018 5:34 PM

## 2018-09-04 NOTE — ED Notes (Signed)
Pt given lunch tray and is eating the pizza at this time. Head of bed sat up while pt eats.

## 2018-09-04 NOTE — ED Provider Notes (Signed)
Vitals:   09/03/18 0051 09/04/18 0655  BP:  127/73  Pulse: 71 79  Resp:  18  Temp:  98.3 F (36.8 C)  SpO2: 98% 100%   No acute events reported to me overnight by physician or nursing report.  Patient is awaiting Geri psych disposition.  Review of social worker notes looks like there are multiple referrals out.   Governor Rooks, MD 09/04/18 [phone]

## 2018-09-04 NOTE — ED Notes (Signed)
Pt is sleeping at this time.

## 2018-09-04 NOTE — ED Notes (Signed)
Pt brief changed and pt repositioned in bed. Pt kept trying to get out of bed while this tech and RN was dealing with other pt getting out of bed. This tech went over to pt and assisted pt to sit back on bed. RN aware of what pt is doing and told this tech to stay by pt side. This tech done so. Pt repositioned in bed again and had two warm blankets placed in him. Pt given meds by RN.

## 2018-09-04 NOTE — ED Notes (Signed)
Gave patient [place] tray and drink. Fed himself with no problem.AS

## 2018-09-04 NOTE — ED Notes (Signed)
Pt ate 1/4 of the pizza and said he was fine for now. He did not want anymore to eat.

## 2018-09-05 ENCOUNTER — Emergency Department: Payer: Medicare Other

## 2018-09-05 NOTE — ED Notes (Signed)
Pt just checked and dry at this time per Amy RN and EDT [place]

## 2018-09-05 NOTE — ED Notes (Signed)
Pt's brother visiting with patient. Nurse tech monitoring visit.   Maintained on 15 minute checks and observation by security camera for safety.

## 2018-09-05 NOTE — ED Notes (Signed)
Pt checked and had large BM. Pt cleaned up and barrier cream applied to buttocks. New duoderm applied to sacrum. Pt has couple bed sores noted to bottom. Redness and some peeling. Pt ambulated with EDT laura and myself in room. Pt repositioned back in bed and given warm blankets. Pt's bed turned so that he can see the TV. Multiple pillows placed under pt to relieve pressure from sacrum. Pt resting comfortably at this time.

## 2018-09-05 NOTE — ED Notes (Signed)
Pt laying bed bed, calm.  Pt stated he felt good this morning.   Maintained on 15 minute checks and observation by security camera for safety.

## 2018-09-05 NOTE — Clinical Social Work Note (Signed)
CSW spoke with patient's son Angello Chien who goes by [employer] [phone], and he said that patient has been approved to go to [facility] in [place].  CSW spoke to April [phone] in charge of admissions who confirmed that patient is able to be accepted either Tuesday or Wednesday.  Patient will need chest x-ray ordered to rule out TB, and home health to be set up with [facility], per admissions coordinator April.  April requested CSW to fax clinical information to facility at [phone]  CSW contacted Psychiatrist Dr. Toni Amend to see if he feels like memory care will be a good option for patient, and he agreed that it would be.  CSW to continue to facilitate discharge planning, CSW updated TTS.  Ervin Knack. Theophilus Walz, MSW, Theresia Majors [phone]  09/05/2018 12:00 PM

## 2018-09-05 NOTE — ED Notes (Signed)
Pt given meal tray.

## 2018-09-05 NOTE — ED Notes (Signed)
Dried patient and turned on his right side.

## 2018-09-05 NOTE — NC FL2 (Signed)
[place] MEDICAID FL2 LEVEL OF CARE SCREENING TOOL     IDENTIFICATION  Patient Name: [name]. Birthdate: Nov 03, 1934 Sex: male Admission Date (Current Location): 08/28/2018  [place] and [place] Number:  Chiropodist and Address:  [facility], [address] [place], [place] [address]      Provider Number: [id number]  Attending Physician Name and Address:  Mordecai Rasmussen, MD  Relative Name and Phone Number:  Ed, Mandich   [phone]     Current Level of Care: Hospital Recommended Level of Care: Memory Care, Assisted Living Facility Prior Approval Number:    Date Approved/Denied:   PASRR Number: [id number]  Discharge Plan: Domiciliary (Rest home)    Current Diagnoses: Patient Active Problem List   Diagnosis Date Noted  . Dementia, multiinfarct, with behavioral disturbance (HCC) 07/08/2018    Orientation RESPIRATION BLADDER Height & Weight     Self  Normal Continent Weight: 178 lb (80.7 kg) Height:     BEHAVIORAL SYMPTOMS/MOOD NEUROLOGICAL BOWEL NUTRITION STATUS      Continent Diet(Regular diet)  AMBULATORY STATUS COMMUNICATION OF NEEDS Skin   Supervision Verbally Normal                       Personal Care Assistance Level of Assistance  Bathing, Feeding, Dressing, Total care Bathing Assistance: Limited assistance Feeding assistance: Independent Dressing Assistance: Limited assistance Total Care Assistance: Limited assistance   Functional Limitations Info  Sight, Hearing, Speech Sight Info: Adequate Hearing Info: Impaired Speech Info: Adequate    SPECIAL CARE FACTORS FREQUENCY  PT (By licensed PT)     PT Frequency: Home Health Minimum 2x a week              Contractures Contractures Info: Not present    Additional Factors Info  Code Status, Allergies, Psychotropic Code Status Info: Full Code Allergies Info: NKA Psychotropic Info: risperiDONE (RISPERDAL M-TABS) disintegrating  tablet 0.5 mg or          Current Medications (09/05/2018):  This is the current hospital active medication list Current Facility-Administered Medications  Medication Dose Route Frequency Provider Last Rate Last Dose  . folic acid (FOLVITE) tablet 1 mg  1 mg Oral Daily Sharman Cheek, MD   1 mg at 09/05/18 8469  . haloperidol lactate (HALDOL) injection 2 mg  2 mg Intramuscular Q6H PRN Clapacs, John T, MD      . risperiDONE (RISPERDAL M-TABS) disintegrating tablet 0.5 mg  0.5 mg Oral BID Clapacs, Jackquline Denmark, MD   0.5 mg at 09/05/18 0939  . rivaroxaban (XARELTO) tablet 20 mg  20 mg Oral Daily Clapacs, Jackquline Denmark, MD   20 mg at 09/05/18 0939  . thiamine (VITAMIN B-1) tablet 100 mg  100 mg Oral Daily Sharman Cheek, MD   100 mg at 09/05/18 6295   Current Outpatient Medications  Medication Sig Dispense Refill  . divalproex (DEPAKOTE SPRINKLE) 125 MG capsule Take 250 mg by mouth 3 (three) times daily.    Marland Kitchen donepezil (ARICEPT) 10 MG tablet Take 10 mg by mouth at bedtime.  2  . LORazepam (ATIVAN) 0.5 MG tablet Take 0.5 mg by mouth every 8 (eight) hours as needed for anxiety.    . memantine (NAMENDA) 10 MG tablet Take 10 mg by mouth 2 (two) times daily.    . Multiple Vitamin (MULTIVITAMIN WITH MINERALS) TABS tablet Take 1 tablet by mouth daily.    Marland Kitchen neomycin-bacitracin-polymyxin (NEOSPORIN) [phone] ointment Apply 1 application  topically daily. Apply to left wrist topically one time a day every Monday, Wednesday, Friday, Sunday for skin tear until heal.    . QUEtiapine (SEROQUEL) 100 MG tablet Take 100 mg by mouth 2 (two) times daily.    . traZODone (DESYREL) 50 MG tablet Take 50 mg by mouth at bedtime. May take every 12 hours as needed for agitation    . XARELTO 20 MG TABS tablet Take 20 mg by mouth daily.  5  . QUEtiapine (SEROQUEL) 25 MG tablet Take 2 tabs, 50 mg, in the evening, and 1 tab, 25 mg, in the morning. (Patient not taking: Reported on 08/29/2018) 50 tablet 0  . vitamin B-12  (CYANOCOBALAMIN) 1000 MCG tablet Take 1,000 mcg by mouth daily at 2 PM.       Discharge Medications: Please see discharge summary for a list of discharge medications.  Relevant Imaging Results:  Relevant Lab Results:   Additional Information SSN [id number]  Darleene Cleaver, [place]

## 2018-09-05 NOTE — ED Notes (Signed)
Pt given water at this time 

## 2018-09-05 NOTE — ED Notes (Signed)
Patient asleep in room. No noted distress or abnormal behavior. Will continue 15 minute checks and observation by security cameras for safety. 

## 2018-09-05 NOTE — ED Notes (Signed)
Patient's son here to visit.  Visit monitored by another RN.

## 2018-09-05 NOTE — ED Notes (Signed)
Report given to Kenisha RN 

## 2018-09-05 NOTE — ED Notes (Signed)
Pt cleaned, depend and pad changed by nurse tech and this Clinical research associate.  Barrier cream and Allevyn Life sacrum adhesive applied.  Pt turned onto left side.   Pt given lunch tray.  Maintained on 15 minute checks and observation by security camera for safety.

## 2018-09-05 NOTE — ED Notes (Signed)
Spoke with MD regarding pt's care and suggested PT consult for pt. Will also notify RN Charge Greg for hospital bed for pt.

## 2018-09-06 LAB — CBC WITH DIFFERENTIAL/PLATELET
Abs Immature Granulocytes: 0.03 10*3/uL (ref 0.00–0.07)
Basophils Absolute: 0 10*3/uL (ref 0.0–0.1)
Basophils Relative: 0 %
EOS ABS: 0.1 10*3/uL (ref 0.0–0.5)
EOS PCT: 2 %
HEMATOCRIT: 47.7 % (ref 39.0–52.0)
Hemoglobin: 15.2 g/dL (ref 13.0–17.0)
Immature Granulocytes: 1 %
LYMPHS ABS: 1.2 10*3/uL (ref 0.7–4.0)
Lymphocytes Relative: 24 %
MCH: 27.5 pg (ref 26.0–34.0)
MCHC: 31.9 g/dL (ref 30.0–36.0)
MCV: 86.3 fL (ref 80.0–100.0)
MONOS PCT: 10 %
Monocytes Absolute: 0.5 10*3/uL (ref 0.1–1.0)
NRBC: 0 % (ref 0.0–0.2)
Neutro Abs: 3.1 10*3/uL (ref 1.7–7.7)
Neutrophils Relative %: 63 %
Platelets: 223 10*3/uL (ref 150–400)
RBC: 5.53 MIL/uL (ref 4.22–5.81)
RDW: 16.3 % — AB (ref 11.5–15.5)
WBC: 4.9 10*3/uL (ref 4.0–10.5)

## 2018-09-06 LAB — BASIC METABOLIC PANEL
Anion gap: 8 (ref 5–15)
BUN: 20 mg/dL (ref 8–23)
CO2: 28 mmol/L (ref 22–32)
CREATININE: 1.11 mg/dL (ref 0.61–1.24)
Calcium: 8.8 mg/dL — ABNORMAL LOW (ref 8.9–10.3)
Chloride: 108 mmol/L (ref 98–111)
GFR calc non Af Amer: 60 mL/min — ABNORMAL LOW (ref >60)
Glucose, Bld: 103 mg/dL — ABNORMAL HIGH (ref 70–99)
Potassium: 3.8 mmol/L (ref 3.5–5.1)
Sodium: 144 mmol/L (ref 135–145)

## 2018-09-06 NOTE — ED Notes (Signed)
Pt given graham crackers and peanut butter.

## 2018-09-06 NOTE — ED Notes (Signed)
Pt meal tray set up, pt in bed eating.

## 2018-09-06 NOTE — ED Notes (Signed)
BEHAVIORAL HEALTH ROUNDING Patient sleeping: No. Patient alert : yes Behavior appropriate: Yes.  ; If no, describe:  Nutrition and fluids offered: yes Toileting and hygiene offered: Yes  Sitter present: q15 minute observations and security monitoring Law enforcement present: Yes  ODS  

## 2018-09-06 NOTE — Progress Notes (Signed)
LCSW received call from [place] [phone] and this worker returned call and awaiting call back  LCSW called April from [facility] [phone]  Jeanene Erb [phone] requested to speak to [place] again and her message she is not in until Wednesday  It appears  no one can except him until Wednesday - As per other LCSW he was tested by x-ray for TB and passed. This LCSW attempting to reach anyone from facility to complete 3- page medical.    Arrie Senate LCSW [phone]

## 2018-09-06 NOTE — ED Notes (Signed)
Pt shaved with the clippers by this tech. Offered to shave with shaving cream and razors afterwards and pt stated "that is enough." Pt laid back down in bed, repositioned and covered with warm blankets.

## 2018-09-06 NOTE — ED Notes (Signed)
Pt observed with no unusual behavior  Appropriate to stimulation  No verbalized needs or concerns at this time  NAD assessed  Continue to monitor 

## 2018-09-06 NOTE — ED Notes (Signed)
Pt showered by this tech and Dorian,EDT. Bed linen changed by Amy,RN.

## 2018-09-06 NOTE — ED Notes (Addendum)
Report received from [place], [place]. Q15 minute rounding sheet taken over. Patient is currently sleeping at this time. Bed is in lowest position and environment is secured. Fall mats in place. Will continue to monitor.

## 2018-09-06 NOTE — ED Notes (Signed)
BEHAVIORAL HEALTH ROUNDING Patient sleeping: Yes.   Patient alert and oriented: eyes closed  Appears to be asleep Behavior appropriate: Yes.  ; If no, describe:  Nutrition and fluids offered: Yes  Toileting and hygiene offered: sleeping Sitter present: q 15 minute observations and security monitoring Law enforcement present: yes  ODS 

## 2018-09-06 NOTE — ED Notes (Signed)

## 2018-09-06 NOTE — ED Notes (Signed)
Gave the patient his lunch tray. 

## 2018-09-06 NOTE — ED Notes (Signed)
Assisted pt to the BR  - moderate amount of hard constipated stool  Some in depends and some in toilet   Pt skin cleansed and clothing changed  - linens changed also  Pt assisted back to recliner and supper warmed and set up for him he is sitting up feeding himself    Continue to monitor

## 2018-09-06 NOTE — ED Notes (Signed)
ED  Is the patient under IVC or is there intent for IVC: Yes.   Is the patient medically cleared: Yes.   Is there vacancy in the ED BHU: Yes.   Is the population mix appropriate for patient: Yes.   Is the patient awaiting placement in inpatient or outpatient setting: Yes.   Has the patient had a psychiatric consult: Yes.   Survey of unit performed for contraband, proper placement and condition of furniture, tampering with fixtures in bathroom, shower, and each patient room: Yes.  ; Findings:  APPEARANCE/BEHAVIOR  cooperative NEURO ASSESSMENT Orientation: alert  Hallucinations: No.None noted (Hallucinations) Speech: Normal  Slow to respond  Gait: normal  Unsteady at times   Stand by assistance provided  RESPIRATORY ASSESSMENT Even  Unlabored respirations  CARDIOVASCULAR ASSESSMENT Pulses equal   regular rate  Skin warm and dry   GASTROINTESTINAL ASSESSMENT no GI complaint EXTREMITIES Full ROM  PLAN OF CARE Provide calm/safe environment. Vital signs assessed twice daily. ED BHU Assessment once each 12-hour shift. Collaborate with TTS daily or as condition indicates. Assure the ED provider has rounded once each shift. Provide and encourage hygiene. Provide redirection as needed. Assess for escalating behavior; address immediately and inform ED provider.  Assess family dynamic and appropriateness for visitation as needed: Yes.  ; If necessary, describe findings:  Educate the patient/family about BHU procedures/visitation: Yes.  ; If necessary, describe findings:

## 2018-09-06 NOTE — ED Provider Notes (Signed)
Vitals:   09/05/18 2104 09/06/18 1008  BP: (!) 95/50 109/67  Pulse: 65 74  Resp:  17  Temp: 98.7 F (37.1 C) 98.2 F (36.8 C)  SpO2: 97% 96%   No acute events reported to me overnight by nursing or physician report.  Patient has been in the ED over a week now.  It sounds like on Friday, about 4 days ago he was difficult to arouse and had a head CT a VBG and a CBC which did not elucidate a cause for his altered mental status.  However it sounds like per the nurse he actually was then improved yesterday and today where he is up and walking around again.  Does not look like he has had laboratory studies since the 13th in terms of metabolic panel, and in 4 days in terms of the CBC so I am going to send those today.  In trying to review his labs and imaging studies, it does appear that there is a chest x-ray that was done yesterday, but there is no note to accompany the reason for obtaining that or the results and the results indicates possible left lower lobe pneumonia.  To my view, this is a very soft call, no clear infiltrate to my evaluation.  On exam today his lungs sound clear, he is not been coughing per the nurse.  I do not think that he is clinically having a pneumonia or a worsening infection at this point time.  His white blood cell count is actually 4.9 today which is down from 8 previously in fact it is possible that may be he was fighting something over the last several days with that slightly increased white blood cell count and altered mental status from baseline which is both now improved.  I do not think the patient warrants any antibiotics at this point in time, and given mental status is back to the baseline -which is a course confused from the dementia standpoint, but is interactive, I do not think he needs inpatient hospitalization at this point.   Governor Rooks, MD 09/06/18 1339

## 2018-09-06 NOTE — Progress Notes (Signed)
Physical Therapy Treatment Patient Details Name: [name]. MRN: [id number] DOB: 12-11-1933 Today's Date: 09/06/2018    History of Present Illness Patient is an 84 year old male brought to the ED after being combative with caregivers.  Pt has gradually developed signs of weakness and change in gait during his stay in the ED.  PMH includes PAF, chronic back pain and acute CVA.    PT Comments    Pt presents with deficits in strength, transfers, mobility, gait, balance, and activity tolerance.  Pt required min A, extensive cuing, and extra time/effort during bed mobility and transfer training.  Pt was able to amb 40' with a RW and min A for stability and for guiding the RW with a second person present for safety.  Pt ambulated with a flexed trunk posture, short B step length, and a very slow cadence with extensive cueing for general safety with amb with a RW.  Overall pt is at a high risk for falls secondary to a combination of cognitive and physical deficits and would benefit from PT services in a SNF setting upon discharge to safely address above deficits for decreased caregiver assistance and eventual return to PLOF.     Follow Up Recommendations  SNF     Equipment Recommendations  Rolling walker with 5" wheels    Recommendations for Other Services       Precautions / Restrictions Precautions Precautions: Fall Restrictions Weight Bearing Restrictions: No    Mobility  Bed Mobility Overal bed mobility: Needs Assistance Bed Mobility: Supine to Sit     Supine to sit: Min assist     General bed mobility comments: Pt lethargic and required repeated verbal and tactile cues for task sequencing  Transfers Overall transfer level: Needs assistance Equipment used: Rolling walker (2 wheeled) Transfers: Sit to/from Stand Sit to Stand: Min assist;+2 safety/equipment         General transfer comment: Pt slow to stand with multiple VC's to stand upright and for general  sequencing  Ambulation/Gait Ambulation/Gait assistance: Min assist;+2 safety/equipment Gait Distance (Feet): 40 Feet Assistive device: Rolling walker (2 wheeled) Gait Pattern/deviations: Step-through pattern;Trunk flexed;Decreased step length - right;Decreased step length - left Gait velocity: Decreased   General Gait Details: Very slow cadence with short B step length and generally unsafe with the RW with extensive verbal and tactile cues for upright posture closer to the RW and inside RW during turns.   Stairs             Wheelchair Mobility    Modified Rankin (Stroke Patients Only)       Balance Overall balance assessment: Needs assistance Sitting-balance support: Bilateral upper extremity supported;Feet supported Sitting balance-Leahy Scale: Fair     Standing balance support: Bilateral upper extremity supported Standing balance-Leahy Scale: Poor Standing balance comment: Heavy reliance on BUE support on the RW                            Cognition Arousal/Alertness: Lethargic Behavior During Therapy: Flat affect Overall Cognitive Status: No family/caregiver present to determine baseline cognitive functioning                                 General Comments: Follows commands inconsistently.       Exercises Total Joint Exercises Ankle Circles/Pumps: Strengthening;Both;10 reps Hip ABduction/ADduction: AROM;Both;5 reps Straight Leg Raises: AROM;Both;10 reps Long Arc Quad: AROM;Both;10  reps Knee Flexion: AROM;Both;10 reps Marching in Standing: AROM;Both;10 reps;Seated    General Comments        Pertinent Vitals/Pain Pain Assessment: No/denies pain    Home Living                      Prior Function            PT Goals (current goals can now be found in the care plan section) Progress towards PT goals: Progressing toward goals    Frequency    Min 2X/week      PT Plan Current plan remains appropriate     Co-evaluation              AM-PAC PT "6 Clicks" Daily Activity  Outcome Measure                   End of Session Equipment Utilized During Treatment: Gait belt Activity Tolerance: Patient tolerated treatment well Patient left: Other (comment)(Pt left with nursing in shower room) Nurse Communication: Mobility status PT Visit Diagnosis: Unsteadiness on feet (R26.81);Other abnormalities of gait and mobility (R26.89)     Time: 1610-9604 PT Time Calculation (min) (ACUTE ONLY): 17 min  Charges:  $Gait Training: 8-22 mins                     D. Scott Kalief Kattner PT, DPT 09/06/18, 12:20 PM

## 2018-09-07 ENCOUNTER — Encounter: Payer: Self-pay | Admitting: Emergency Medicine

## 2018-09-07 MED ORDER — QUETIAPINE FUMARATE 100 MG PO TABS: 100.0000 mg | ORAL_TABLET | Freq: Two times a day (BID) | ORAL | 0 refills | Status: AC

## 2018-09-07 MED ORDER — DIVALPROEX SODIUM 125 MG PO CSDR: 250.0000 mg | DELAYED_RELEASE_CAPSULE | Freq: Three times a day (TID) | ORAL | 0 refills | Status: AC

## 2018-09-07 MED ORDER — ADULT MULTIVITAMIN W/MINERALS CH: 1.0000 | ORAL_TABLET | Freq: Every day | ORAL | 0 refills | Status: AC

## 2018-09-07 MED ORDER — RISPERIDONE 0.5 MG PO TABS: 0.5000 mg | ORAL_TABLET | Freq: Two times a day (BID) | ORAL | 0 refills | Status: AC

## 2018-09-07 MED ORDER — XARELTO 20 MG PO TABS: 20.0000 mg | ORAL_TABLET | Freq: Every day | ORAL | 2 refills | Status: AC

## 2018-09-07 MED ORDER — MEMANTINE HCL 10 MG PO TABS: 10.0000 mg | ORAL_TABLET | Freq: Two times a day (BID) | ORAL | 0 refills | Status: AC

## 2018-09-07 MED ORDER — VITAMIN B-12 1000 MCG PO TABS: 1000.0000 ug | ORAL_TABLET | Freq: Every day | ORAL | 0 refills | Status: AC

## 2018-09-07 MED ORDER — DONEPEZIL HCL 10 MG PO TABS: 10.0000 mg | ORAL_TABLET | Freq: Every day | ORAL | 2 refills | Status: AC

## 2018-09-07 MED ORDER — TRIPLE ANTIBIOTIC 5-400-5000 EX OINT: 1.0000 "application " | TOPICAL_OINTMENT | Freq: Every day | CUTANEOUS | 0 refills | Status: AC

## 2018-09-07 NOTE — ED Provider Notes (Signed)
-----------------------------------------   1:29 AM on 09/07/2018 -----------------------------------------   Blood pressure 105/72, pulse 74, temperature 98.9 F (37.2 C), temperature source Oral, resp. rate 17, weight 80.7 kg, SpO2 98 %.  The patient had no acute events since last update.  Calm and cooperative at this time.  Disposition is pending geripsych placement.   Merrily Brittle, MD 09/07/18 [phone]

## 2018-09-07 NOTE — ED Notes (Signed)
IVC PAPERS  RESCINDED  PER  DR  STAFFORD  MD  INFORMED  RN  RHEA  AND  ODS OFFICER

## 2018-09-07 NOTE — ED Notes (Signed)
Pt's son here to pick up patient.  Pt wants the MD to order risperidone for the patient.  He states he was on the other medications prior to coming to the hospital and he feels that risperidone would help the patient.  Dr. Scotty Court notified.

## 2018-09-07 NOTE — Progress Notes (Signed)
LCSW received a text message and patients son is coming by1-2 pm today to pick up his father and to take him to [facility] LCSW called and left message for April administrator and am awaiting call back, [phone]   [employer] LCSW [phone]

## 2018-09-07 NOTE — ED Provider Notes (Signed)
Patient has been accepted to a skilled nursing facility.  The son will come pick the patient up to take him home and then to the skilled nursing facility from there when ready.  I have provided prescriptions for all the patient's long-term medications.  He is medically stable and calm.   Sharman Cheek, MD 09/07/18 [phone]

## 2018-09-07 NOTE — Progress Notes (Signed)
LCSW received a call back from [facility] and she is aware that patient will be coming today after 2pm, she requested we fax doctors TB report and Fl2, hard copy scripts will go with patient.  Consulted ED secretary and EDP and explained this discharge plan, ED secretary is arranging all paper work for discharge and EDP will make hard copy scripts. LCSW faxed over the Fl2 and report and texted son that DC paperwork will all be complete by the time he pick up his father.  [employer] LCSW [phone]

## 2019-07-13 ENCOUNTER — Encounter (HOSPITAL_COMMUNITY): Payer: Self-pay

## 2019-07-13 ENCOUNTER — Emergency Department (HOSPITAL_COMMUNITY): Payer: Medicare Other

## 2019-07-13 ENCOUNTER — Other Ambulatory Visit: Payer: Self-pay

## 2019-07-13 ENCOUNTER — Emergency Department (HOSPITAL_COMMUNITY)
Admission: EM | Admit: 2019-07-13 | Discharge: 2019-07-13 | Disposition: A | Discharge status: Home / Self Care | Payer: Medicare Other | Attending: Emergency Medicine | Admitting: Emergency Medicine

## 2019-07-13 DIAGNOSIS — R4182 Altered mental status, unspecified: Secondary | ICD-10-CM | POA: Diagnosis present

## 2019-07-13 DIAGNOSIS — F0150 Vascular dementia without behavioral disturbance: Secondary | ICD-10-CM | POA: Insufficient documentation

## 2019-07-13 DIAGNOSIS — I959 Hypotension, unspecified: Secondary | ICD-10-CM | POA: Diagnosis not present

## 2019-07-13 DIAGNOSIS — Z7982 Long term (current) use of aspirin: Secondary | ICD-10-CM | POA: Insufficient documentation

## 2019-07-13 DIAGNOSIS — I480 Paroxysmal atrial fibrillation: Secondary | ICD-10-CM | POA: Diagnosis not present

## 2019-07-13 DIAGNOSIS — Z8673 Personal history of transient ischemic attack (TIA), and cerebral infarction without residual deficits: Secondary | ICD-10-CM | POA: Diagnosis not present

## 2019-07-13 DIAGNOSIS — I4510 Unspecified right bundle-branch block: Secondary | ICD-10-CM | POA: Insufficient documentation

## 2019-07-13 DIAGNOSIS — Z79899 Other long term (current) drug therapy: Secondary | ICD-10-CM | POA: Diagnosis not present

## 2019-07-13 LAB — LACTIC ACID, PLASMA: Lactic Acid, Venous: 0.9 mmol/L (ref 0.5–1.9)

## 2019-07-13 LAB — COMPREHENSIVE METABOLIC PANEL
ALT: 16 U/L (ref 0–44)
AST: 20 U/L (ref 15–41)
Albumin: 3.4 g/dL — ABNORMAL LOW (ref 3.5–5.0)
Alkaline Phosphatase: 51 U/L (ref 38–126)
Anion gap: 11 (ref 5–15)
BUN: 17 mg/dL (ref 8–23)
CO2: 23 mmol/L (ref 22–32)
Calcium: 8.5 mg/dL — ABNORMAL LOW (ref 8.9–10.3)
Chloride: 101 mmol/L (ref 98–111)
Creatinine, Ser: 1.07 mg/dL (ref 0.61–1.24)
GFR calc Af Amer: >60 mL/min (ref >60)
GFR calc non Af Amer: >60 mL/min (ref >60)
Glucose, Bld: 104 mg/dL — ABNORMAL HIGH (ref 70–99)
Potassium: 3.8 mmol/L (ref 3.5–5.1)
Sodium: 135 mmol/L (ref 135–145)
Total Bilirubin: 1.1 mg/dL (ref 0.3–1.2)
Total Protein: 5.6 g/dL — ABNORMAL LOW (ref 6.5–8.1)

## 2019-07-13 LAB — URINALYSIS, ROUTINE W REFLEX MICROSCOPIC
Bilirubin Urine: NEGATIVE
Glucose, UA: NEGATIVE mg/dL
Hgb urine dipstick: NEGATIVE
Ketones, ur: NEGATIVE mg/dL
Leukocytes,Ua: NEGATIVE
Nitrite: NEGATIVE
Protein, ur: NEGATIVE mg/dL
Specific Gravity, Urine: 1.005 (ref 1.005–1.030)
pH: 6 (ref 5.0–8.0)

## 2019-07-13 LAB — CBC WITH DIFFERENTIAL/PLATELET
Abs Immature Granulocytes: 0.02 10*3/uL (ref 0.00–0.07)
Basophils Absolute: 0 10*3/uL (ref 0.0–0.1)
Basophils Relative: 0 %
Eosinophils Absolute: 0.1 10*3/uL (ref 0.0–0.5)
Eosinophils Relative: 1 %
HCT: 41.2 % (ref 39.0–52.0)
Hemoglobin: 13.5 g/dL (ref 13.0–17.0)
Immature Granulocytes: 0 %
Lymphocytes Relative: 17 %
Lymphs Abs: 1.3 10*3/uL (ref 0.7–4.0)
MCH: 28.8 pg (ref 26.0–34.0)
MCHC: 32.8 g/dL (ref 30.0–36.0)
MCV: 87.8 fL (ref 80.0–100.0)
Monocytes Absolute: 0.5 10*3/uL (ref 0.1–1.0)
Monocytes Relative: 7 %
Neutro Abs: 5.6 10*3/uL (ref 1.7–7.7)
Neutrophils Relative %: 75 %
Platelets: 147 10*3/uL — ABNORMAL LOW (ref 150–400)
RBC: 4.69 MIL/uL (ref 4.22–5.81)
RDW: 15.2 % (ref 11.5–15.5)
WBC: 7.5 10*3/uL (ref 4.0–10.5)
nRBC: 0 % (ref 0.0–0.2)

## 2019-07-13 LAB — APTT: aPTT: 23 seconds — ABNORMAL LOW (ref 24–36)

## 2019-07-13 LAB — PROTIME-INR
INR: 1.1 (ref 0.8–1.2)
Prothrombin Time: 13.7 seconds (ref 11.4–15.2)

## 2019-07-13 LAB — VALPROIC ACID LEVEL: Valproic Acid Lvl: <10 ug/mL — ABNORMAL LOW (ref 50.0–100.0)

## 2019-07-13 MED ORDER — SODIUM CHLORIDE 0.9 % IV SOLN
1000.0000 mL | INTRAVENOUS | Status: DC
  Administered 2019-07-13: 1000 mL via INTRAVENOUS

## 2019-07-13 NOTE — ED Notes (Signed)
Condom cath. Is on and hooked to cath bag.

## 2019-07-13 NOTE — ED Notes (Signed)
Pt son Giovan Pinsky [phone]

## 2019-07-13 NOTE — Discharge Instructions (Addendum)
You have been seen today for altered mental status. Please read and follow all provided instructions. Return to the emergency room for worsening condition or new concerning symptoms.    Your head CT was negative for signs of a stroke. Your urine does not show signs of urinary tract infection. Your blood work all looks normal today. It is possible your symptoms were caused today after sitting outside in the heat for too long.   1. Medications:  Continue usual home medications  2. Treatment: rest, drink plenty of fluids  3. Follow Up: Please follow up with your primary doctor in 2-5 days for discussion of your diagnoses and further evaluation after today's visit; Call today to arrange your follow up.    ?

## 2019-07-13 NOTE — ED Provider Notes (Signed)
[facility] EMERGENCY DEPARTMENT Provider Note   CSN: [id number] Arrival date & time: 07/13/19  1137     History   Chief Complaint Chief Complaint  Patient presents with   Altered Mental Status    HPI [name]. is a 85 y.o. male with past medical history of chronic back pain, paroxysmal A. fib, dementia presents emergency department today via EMS for chief complaint of altered mental status.  Pt is a resident at [facility].  EMS states patient was found by staff sitting on a bench outside in the atrium.  He was unresponsive and diaphoretic.  He was in a seated position, resting on his shoulder, does not appear that he fell. Staff estimates he was out there for 30 minutes.  Patient has a history of dementia but at baseline is typically alert and oriented and able to answer questions. Blood pressure for EMS was 74/30.  He was given 500 mL normal saline blood pressure improved to 100/65.  Patient is responsive to painful stimuli.  In route patient seemed to be more responsive to answering questions appropriately. Pt is unable to provide history secondary to dementia, level 5 caveat applies.      Past Medical History:  Diagnosis Date   Acute CVA (cerebrovascular accident) (HCC)    subacute   Chronic back pain    PAF (paroxysmal atrial fibrillation) [facility])     Patient Active Problem List   Diagnosis Date Noted   Dementia, multiinfarct, with behavioral disturbance (HCC) 07/08/2018    History reviewed. No pertinent surgical history.      Home Medications    Prior to Admission medications   Medication Sig Start Date End Date Taking? Authorizing Provider  acetaminophen (TYLENOL) 500 MG tablet Take 500 mg by mouth every 4 (four) hours as needed for mild pain.   Yes [provider]  aspirin 81 MG chewable tablet Chew 81 mg by mouth daily.   Yes [provider]  busPIRone (BUSPAR) 10 MG tablet Take 10 mg by mouth 3 (three)  times daily. 06/15/19  Yes [provider]  furosemide (LASIX) 20 MG tablet Take 20 mg by mouth daily. 07/04/19  Yes [provider]  loperamide (IMODIUM) 2 MG capsule Take 2 mg by mouth every 4 (four) hours as needed for diarrhea or loose stools.  04/24/19  Yes [provider]  LORazepam (ATIVAN) 0.5 MG tablet Take 0.5 mg by mouth 2 (two) times daily. 07/05/19  Yes [provider]  LORazepam (ATIVAN) 0.5 MG tablet Take 0.5 mg by mouth 2 (two) times daily as needed for anxiety.   Yes [provider]  risperiDONE (RISPERDAL) 0.5 MG tablet Take 1 tablet (0.5 mg total) by mouth 2 (two) times daily. 09/07/18 07/13/19 Yes Sharman Cheek, MD  risperiDONE (RISPERDAL) 0.5 MG tablet Take 0.5 mg by mouth every 6 (six) hours as needed (Anxiety).   Yes [provider]  Skin Protectants, Misc. (BAZA PROTECT EX) Apply 1 application topically 3 (three) times daily.   Yes [provider]  divalproex (DEPAKOTE SPRINKLE) 125 MG capsule Take 2 capsules (250 mg total) by mouth 3 (three) times daily. Patient not taking: Reported on 07/13/2019 09/07/18 10/07/18  Sharman Cheek, MD  donepezil (ARICEPT) 10 MG tablet Take 1 tablet (10 mg total) by mouth at bedtime. Patient not taking: Reported on 07/13/2019 09/07/18   Sharman Cheek, MD  memantine (NAMENDA) 10 MG tablet Take 1 tablet (10 mg total) by mouth 2 (two) times  daily. 09/07/18 10/07/18  Carrie Mew, MD  neomycin-bacitracin-polymyxin (NEOSPORIN) [phone] ointment Apply 1 application topically daily. Apply topically one time a day as needed every Monday, Wednesday, Friday, Sunday for skin tears until healed. Patient not taking: Reported on 07/13/2019 09/07/18 09/07/19  Carrie Mew, MD  QUEtiapine (SEROQUEL) 100 MG tablet Take 1 tablet (100 mg total) by mouth 2 (two) times daily. Patient not taking: Reported on 07/13/2019 09/07/18 10/07/18  Carrie Mew, MD  XARELTO 20 MG TABS tablet Take  1 tablet (20 mg total) by mouth daily. Patient not taking: Reported on 07/13/2019 09/07/18 10/07/18  Carrie Mew, MD    Family History Family History  Problem Relation Age of Onset   Hypertension Other        family history    Social History Social History   Tobacco Use   Smoking status: Not on file  Substance Use Topics   Alcohol use: Not on file   Drug use: Not on file     Allergies   Patient has no known allergies.   Review of Systems Review of Systems  Unable to perform ROS: Dementia     Physical Exam Updated Vital Signs BP 108/62 (BP Location: Right Arm)    Pulse 93    Temp 98.3 F (36.8 C) (Rectal)    Resp 16    Ht 6' (1.829 m)    Wt 80 kg    SpO2 96%    BMI 23.92 kg/m   Physical Exam Vitals signs and nursing note reviewed.  Constitutional:      General: He is not in acute distress.    Appearance: Normal appearance. He is not ill-appearing or toxic-appearing.  HENT:     Head: Normocephalic and atraumatic.     Comments: No tenderness to palpation of skull. No deformities or crepitus noted. No open wounds, abrasions or lacerations.    Right Ear: Tympanic membrane and external ear normal.     Left Ear: Tympanic membrane and external ear normal.     Nose: Nose normal.     Mouth/Throat:     Mouth: Mucous membranes are dry.  Eyes:     General: No scleral icterus.       Right eye: No discharge.        Left eye: No discharge.     Conjunctiva/sclera: Conjunctivae normal.     Pupils: Pupils are equal, round, and reactive to light.  Neck:     Musculoskeletal: Normal range of motion. No muscular tenderness.  Cardiovascular:     Rate and Rhythm: Normal rate and regular rhythm.     Pulses: Normal pulses.     Heart sounds: Normal heart sounds.  Pulmonary:     Comments: Pt with nonproductive cough during exam. Spo2 is 96% on room air. Normal work of breathing, symmetric chest rise. Lungs are clear to auscultation in all fields. Abdominal:     General:  Bowel sounds are normal. There is no distension.     Palpations: Abdomen is soft.     Tenderness: There is no abdominal tenderness. There is no guarding or rebound.  Musculoskeletal:        General: No tenderness or signs of injury.     Right lower leg: Edema present.     Left lower leg: Edema present.  Lymphadenopathy:     Cervical: No cervical adenopathy.  Skin:    General: Skin is warm and dry.     Capillary Refill: Capillary refill takes less than 2 seconds.  Findings: No rash.  Neurological:     GCS: GCS eye subscore is 4. GCS verbal subscore is 4. GCS motor subscore is 6.     Comments: Pt is alert to self only. He follows simple commands after being asked several times. Grip strength is equal bilaterally and 5/5. No facial droop, speech is clear. He is not forth coming with information.      ED Treatments / Results  Labs (all labs ordered are listed, but only abnormal results are displayed) Labs Reviewed  COMPREHENSIVE METABOLIC PANEL - Abnormal; Notable for the following components:      Result Value   Glucose, Bld 104 (*)    Calcium 8.5 (*)    Total Protein 5.6 (*)    Albumin 3.4 (*)    All other components within normal limits  CBC WITH DIFFERENTIAL/PLATELET - Abnormal; Notable for the following components:   Platelets 147 (*)    All other components within normal limits  URINALYSIS, ROUTINE W REFLEX MICROSCOPIC - Abnormal; Notable for the following components:   Color, Urine STRAW (*)    All other components within normal limits  VALPROIC ACID LEVEL - Abnormal; Notable for the following components:   Valproic Acid Lvl <10 (*)    All other components within normal limits  APTT - Abnormal; Notable for the following components:   aPTT 23 (*)    All other components within normal limits  CULTURE, BLOOD (ROUTINE X 2)  CULTURE, BLOOD (ROUTINE X 2)  URINE CULTURE  LACTIC ACID, PLASMA  PROTIME-INR    EKG EKG Interpretation  Date/Time:  Thursday July 13 2019  11:48:12 EDT Ventricular Rate:  63 PR Interval:    QRS Duration: 136 QT Interval:  446 QTC Calculation: 457 R Axis:   43 Text Interpretation:  Sinus rhythm Right bundle branch block Confirmed by Cathren LaineSteinl, Kevin ([id number]) on 07/13/2019 12:01:28 PM   Radiology Ct Head Wo Contrast  Result Date: 07/13/2019 CLINICAL DATA:  Altered level of consciousness.  History of dementia EXAM: CT HEAD WITHOUT CONTRAST TECHNIQUE: Contiguous axial images were obtained from the base of the skull through the vertex without intravenous contrast. COMPARISON:  September 02, 2018 FINDINGS: Brain: There is moderate diffuse atrophy with atrophy most notable in the temporal lobes. There is no intracranial mass, hemorrhage, extra-axial fluid collection, or midline shift. There is evidence of a prior focal infarct at the superior posterior right temporal-anterior occipital junction, stable. There is patchy small vessel disease in the centra semiovale bilaterally. Small vessel disease is also noted in the basilar perforator distribution involving portions of midbrain and pons. No acute infarct evident. Vascular: There is no hyperdense vessel. There is calcification in the carotid siphon regions, more severe on the right than on the left. Skull: The bony calvarium appears intact. Sinuses/Orbits: There is mucosal thickening in several ethmoid air cells. Other paranasal sinuses are clear. Orbits appear symmetric bilaterally. Other: Mastoid air cells are clear. There is debris in each external auditory canal. IMPRESSION: Stable atrophy. Prior infarct on the right at the temporal-occipital junction. Elsewhere there is patchy small vessel disease in the periventricular white matter and in the basilar perforator distribution. No acute infarct evident. No mass or hemorrhage. There are foci of arterial vascular calcification. There is mucosal thickening in several ethmoid air cells. There is probable cerumen in each external auditory canal.  Electronically Signed   By: Bretta BangWilliam  Woodruff III M.D.   On: 07/13/2019 14:07   Dg Chest Port 1 View  Result Date:  07/13/2019 CLINICAL DATA:  Cough. EXAM: PORTABLE CHEST 1 VIEW COMPARISON:  Radiographs of September 05, 2018. FINDINGS: The heart size and mediastinal contours are within normal limits. Both lungs are clear. The visualized skeletal structures are unremarkable. IMPRESSION: No active disease. Electronically Signed   By: Lupita RaiderJames  Green Jr M.D.   On: 07/13/2019 12:20    Procedures Procedures (including critical care time)  Medications Ordered in ED Medications - No data to display   Initial Impression / Assessment and Plan / ED Course  I have reviewed the triage vital signs and the nursing notes.  Pertinent labs & imaging results that were available during my care of the patient were reviewed by me and considered in my medical decision making (see chart for details).  [facility] sitting outside in the heat. Hypotensive for EMS, improved with fluids. On arrival to ED pt is normotensive 108/62, no hypoxia or tachypnea. Afebrile. As pt was found altered. Septic work up initiated including head ct. CBC, CMP, aPTT are overall unremarkable. INR and lactic acid within normal range. Chest xray viewed by me is clear, no infiltrate seen. CT head without signs of bleeding, no acute findings. UA without signs of infection. On reassessment pt is more alert and oriented after IVF. Pt's son is at the bedside and states pt appears to be at baseline. Work up is overall negative. Suspect heat exhaustion. The patient appears reasonably screened and/or stabilized for discharge and I doubt any other medical condition or other [facility] requiring further screening, evaluation, or treatment in the ED at this time prior to discharge. The patient is safe for discharge with strict return precautions discussed. Recommend pcp follow up. The patient was  discussed with and seen by Dr. Denton LankSteinl who agrees with the treatment plan.  This note was prepared using Dragon voice recognition software and may include unintentional dictation errors due to the inherent limitations of voice recognition software.    Final Clinical Impressions(s) / ED Diagnoses   Final diagnoses:  Altered mental status, unspecified altered mental status type    ED Discharge Orders    None       Sherene Sireslbrizze, Cahlil Sattar E, PA-C 07/14/19 [id number]    Cathren LaineSteinl, Kevin, MD 07/14/19 [phone]

## 2019-07-13 NOTE — ED Notes (Signed)
[facility] called @ 1628-per RN called by Levada Dy

## 2019-07-13 NOTE — ED Triage Notes (Signed)
Pt brought into the ER by EMS for altered mental status. EMS stated that the patient was found by staff at the assisted living facility sitting on a bench unresponsive and diaphoretic. The pt has dementia but has a baseline of being alert, oriented, and answering questions appropriately. EMS initiated an 18 gauge IV in the left AC and administered 500 ml of NS for hypotension. BP went from 74/30 to 100/65 according to EMS.

## 2019-07-14 LAB — URINE CULTURE: Culture: NO GROWTH

## 2019-07-18 LAB — CULTURE, BLOOD (ROUTINE X 2)
Culture: NO GROWTH
Culture: NO GROWTH
Special Requests: ADEQUATE

## 2019-10-05 ENCOUNTER — Encounter (HOSPITAL_COMMUNITY): Payer: Self-pay

## 2019-10-05 ENCOUNTER — Inpatient Hospital Stay (HOSPITAL_COMMUNITY): Payer: Medicare Other

## 2019-10-05 ENCOUNTER — Other Ambulatory Visit: Payer: Self-pay

## 2019-10-05 ENCOUNTER — Inpatient Hospital Stay (HOSPITAL_COMMUNITY)
Admission: EM | Admit: 2019-10-05 | Discharge: 2019-10-17 | DRG: 177 | Disposition: E | Payer: Medicare Other | Source: Skilled Nursing Facility | Attending: Internal Medicine | Admitting: Internal Medicine

## 2019-10-05 ENCOUNTER — Emergency Department (HOSPITAL_COMMUNITY): Payer: Medicare Other

## 2019-10-05 DIAGNOSIS — Z7982 Long term (current) use of aspirin: Secondary | ICD-10-CM | POA: Diagnosis not present

## 2019-10-05 DIAGNOSIS — R1310 Dysphagia, unspecified: Secondary | ICD-10-CM | POA: Diagnosis not present

## 2019-10-05 DIAGNOSIS — G8929 Other chronic pain: Secondary | ICD-10-CM | POA: Diagnosis present

## 2019-10-05 DIAGNOSIS — R627 Adult failure to thrive: Secondary | ICD-10-CM | POA: Diagnosis present

## 2019-10-05 DIAGNOSIS — G9341 Metabolic encephalopathy: Secondary | ICD-10-CM | POA: Diagnosis present

## 2019-10-05 DIAGNOSIS — Z66 Do not resuscitate: Secondary | ICD-10-CM | POA: Diagnosis not present

## 2019-10-05 DIAGNOSIS — I82432 Acute embolism and thrombosis of left popliteal vein: Secondary | ICD-10-CM | POA: Diagnosis present

## 2019-10-05 DIAGNOSIS — Z79899 Other long term (current) drug therapy: Secondary | ICD-10-CM

## 2019-10-05 DIAGNOSIS — I82412 Acute embolism and thrombosis of left femoral vein: Secondary | ICD-10-CM | POA: Diagnosis present

## 2019-10-05 DIAGNOSIS — Z8673 Personal history of transient ischemic attack (TIA), and cerebral infarction without residual deficits: Secondary | ICD-10-CM | POA: Diagnosis not present

## 2019-10-05 DIAGNOSIS — R0600 Dyspnea, unspecified: Secondary | ICD-10-CM

## 2019-10-05 DIAGNOSIS — Z8249 Family history of ischemic heart disease and other diseases of the circulatory system: Secondary | ICD-10-CM

## 2019-10-05 DIAGNOSIS — F0151 Vascular dementia with behavioral disturbance: Secondary | ICD-10-CM

## 2019-10-05 DIAGNOSIS — R404 Transient alteration of awareness: Secondary | ICD-10-CM | POA: Diagnosis not present

## 2019-10-05 DIAGNOSIS — R0682 Tachypnea, not elsewhere classified: Secondary | ICD-10-CM

## 2019-10-05 DIAGNOSIS — U071 COVID-19: Secondary | ICD-10-CM | POA: Diagnosis not present

## 2019-10-05 DIAGNOSIS — I739 Peripheral vascular disease, unspecified: Secondary | ICD-10-CM | POA: Diagnosis present

## 2019-10-05 DIAGNOSIS — I2699 Other pulmonary embolism without acute cor pulmonale: Secondary | ICD-10-CM | POA: Diagnosis not present

## 2019-10-05 DIAGNOSIS — R4182 Altered mental status, unspecified: Secondary | ICD-10-CM

## 2019-10-05 DIAGNOSIS — F01518 Vascular dementia, unspecified severity, with other behavioral disturbance: Secondary | ICD-10-CM | POA: Diagnosis present

## 2019-10-05 DIAGNOSIS — J1289 Other viral pneumonia: Secondary | ICD-10-CM | POA: Diagnosis present

## 2019-10-05 DIAGNOSIS — Z7189 Other specified counseling: Secondary | ICD-10-CM | POA: Diagnosis not present

## 2019-10-05 DIAGNOSIS — I480 Paroxysmal atrial fibrillation: Secondary | ICD-10-CM | POA: Diagnosis present

## 2019-10-05 DIAGNOSIS — M549 Dorsalgia, unspecified: Secondary | ICD-10-CM | POA: Diagnosis present

## 2019-10-05 DIAGNOSIS — J9600 Acute respiratory failure, unspecified whether with hypoxia or hypercapnia: Secondary | ICD-10-CM | POA: Diagnosis present

## 2019-10-05 DIAGNOSIS — J9601 Acute respiratory failure with hypoxia: Secondary | ICD-10-CM | POA: Diagnosis present

## 2019-10-05 DIAGNOSIS — R0602 Shortness of breath: Secondary | ICD-10-CM | POA: Diagnosis not present

## 2019-10-05 DIAGNOSIS — I2602 Saddle embolus of pulmonary artery with acute cor pulmonale: Secondary | ICD-10-CM | POA: Diagnosis not present

## 2019-10-05 DIAGNOSIS — Z515 Encounter for palliative care: Secondary | ICD-10-CM | POA: Diagnosis not present

## 2019-10-05 DIAGNOSIS — R0603 Acute respiratory distress: Secondary | ICD-10-CM | POA: Diagnosis not present

## 2019-10-05 LAB — URINALYSIS, ROUTINE W REFLEX MICROSCOPIC
Bacteria, UA: NONE SEEN
Bilirubin Urine: NEGATIVE
Glucose, UA: NEGATIVE mg/dL
Ketones, ur: 5 mg/dL — AB
Leukocytes,Ua: NEGATIVE
Nitrite: NEGATIVE
Protein, ur: 100 mg/dL — AB
Specific Gravity, Urine: 1.025 (ref 1.005–1.030)
pH: 5 (ref 5.0–8.0)

## 2019-10-05 LAB — COMPREHENSIVE METABOLIC PANEL
ALT: 60 U/L — ABNORMAL HIGH (ref 0–44)
AST: 123 U/L — ABNORMAL HIGH (ref 15–41)
Albumin: 3.4 g/dL — ABNORMAL LOW (ref 3.5–5.0)
Alkaline Phosphatase: 66 U/L (ref 38–126)
Anion gap: 13 (ref 5–15)
BUN: 18 mg/dL (ref 8–23)
CO2: 26 mmol/L (ref 22–32)
Calcium: 8.5 mg/dL — ABNORMAL LOW (ref 8.9–10.3)
Chloride: 101 mmol/L (ref 98–111)
Creatinine, Ser: 0.8 mg/dL (ref 0.61–1.24)
GFR calc Af Amer: >60 mL/min (ref >60)
GFR calc non Af Amer: >60 mL/min (ref >60)
Glucose, Bld: 116 mg/dL — ABNORMAL HIGH (ref 70–99)
Potassium: 4.2 mmol/L (ref 3.5–5.1)
Sodium: 140 mmol/L (ref 135–145)
Total Bilirubin: 0.8 mg/dL (ref 0.3–1.2)
Total Protein: 6.7 g/dL (ref 6.5–8.1)

## 2019-10-05 LAB — CBC WITH DIFFERENTIAL/PLATELET
Abs Immature Granulocytes: 0.03 10*3/uL (ref 0.00–0.07)
Basophils Absolute: 0 10*3/uL (ref 0.0–0.1)
Basophils Relative: 0 %
Eosinophils Absolute: 0 10*3/uL (ref 0.0–0.5)
Eosinophils Relative: 0 %
HCT: 49.9 % (ref 39.0–52.0)
Hemoglobin: 16.2 g/dL (ref 13.0–17.0)
Immature Granulocytes: 1 %
Lymphocytes Relative: 13 %
Lymphs Abs: 0.8 10*3/uL (ref 0.7–4.0)
MCH: 28.3 pg (ref 26.0–34.0)
MCHC: 32.5 g/dL (ref 30.0–36.0)
MCV: 87.1 fL (ref 80.0–100.0)
Monocytes Absolute: 0.4 10*3/uL (ref 0.1–1.0)
Monocytes Relative: 6 %
Neutro Abs: 4.7 10*3/uL (ref 1.7–7.7)
Neutrophils Relative %: 80 %
Platelets: 141 10*3/uL — ABNORMAL LOW (ref 150–400)
RBC: 5.73 MIL/uL (ref 4.22–5.81)
RDW: 15.3 % (ref 11.5–15.5)
WBC: 5.9 10*3/uL (ref 4.0–10.5)
nRBC: 0 % (ref 0.0–0.2)

## 2019-10-05 LAB — CBC
HCT: 50.6 % (ref 39.0–52.0)
Hemoglobin: 16.8 g/dL (ref 13.0–17.0)
MCH: 28.4 pg (ref 26.0–34.0)
MCHC: 33.2 g/dL (ref 30.0–36.0)
MCV: 85.6 fL (ref 80.0–100.0)
Platelets: 138 10*3/uL — ABNORMAL LOW (ref 150–400)
RBC: 5.91 MIL/uL — ABNORMAL HIGH (ref 4.22–5.81)
RDW: 15.1 % (ref 11.5–15.5)
WBC: 6.4 10*3/uL (ref 4.0–10.5)
nRBC: 0 % (ref 0.0–0.2)

## 2019-10-05 LAB — ABO/RH: ABO/RH(D): A POS

## 2019-10-05 LAB — PROCALCITONIN
Procalcitonin: <0.1 ng/mL
Procalcitonin: <0.1 ng/mL

## 2019-10-05 LAB — APTT: aPTT: 36 seconds (ref 24–36)

## 2019-10-05 LAB — CREATININE, SERUM
Creatinine, Ser: 0.86 mg/dL (ref 0.61–1.24)
GFR calc Af Amer: >60 mL/min (ref >60)
GFR calc non Af Amer: >60 mL/min (ref >60)

## 2019-10-05 LAB — TRIGLYCERIDES: Triglycerides: 89 mg/dL (ref <150)

## 2019-10-05 LAB — PROTIME-INR
INR: 1 (ref 0.8–1.2)
Prothrombin Time: 12.7 seconds (ref 11.4–15.2)

## 2019-10-05 LAB — C-REACTIVE PROTEIN: CRP: 7.1 mg/dL — ABNORMAL HIGH (ref <1.0)

## 2019-10-05 LAB — LACTIC ACID, PLASMA
Lactic Acid, Venous: 1.5 mmol/L (ref 0.5–1.9)
Lactic Acid, Venous: 1.9 mmol/L (ref 0.5–1.9)

## 2019-10-05 LAB — FIBRINOGEN: Fibrinogen: 529 mg/dL — ABNORMAL HIGH (ref 210–475)

## 2019-10-05 LAB — POC SARS CORONAVIRUS 2 AG -  ED: SARS Coronavirus 2 Ag: POSITIVE — AB

## 2019-10-05 LAB — FERRITIN: Ferritin: 322 ng/mL (ref 24–336)

## 2019-10-05 LAB — LACTATE DEHYDROGENASE: LDH: 403 U/L — ABNORMAL HIGH (ref 98–192)

## 2019-10-05 LAB — D-DIMER, QUANTITATIVE: D-Dimer, Quant: 19.06 ug/mL-FEU — ABNORMAL HIGH (ref 0.00–0.50)

## 2019-10-05 MED ORDER — HEPARIN (PORCINE) 25000 UT/250ML-% IV SOLN
1300.0000 [IU]/h | INTRAVENOUS | Status: DC
  Administered 2019-10-05 – 2019-10-07 (×4): 1300 [IU]/h via INTRAVENOUS
  Filled 2019-10-05 (×4): qty 250

## 2019-10-05 MED ORDER — IOHEXOL 350 MG/ML SOLN
100.0000 mL | Freq: Once | INTRAVENOUS | Status: AC | PRN
  Administered 2019-10-05: 100 mL via INTRAVENOUS

## 2019-10-05 MED ORDER — IPRATROPIUM-ALBUTEROL 20-100 MCG/ACT IN AERS
1.0000 | INHALATION_SPRAY | Freq: Four times a day (QID) | RESPIRATORY_TRACT | Status: DC
  Administered 2019-10-05 – 2019-10-07 (×7): 1 via RESPIRATORY_TRACT
  Filled 2019-10-05: qty 4

## 2019-10-05 MED ORDER — MEMANTINE HCL 10 MG PO TABS
10.0000 mg | ORAL_TABLET | Freq: Two times a day (BID) | ORAL | Status: DC
  Administered 2019-10-05 – 2019-10-07 (×3): 10 mg via ORAL
  Filled 2019-10-05 (×3): qty 1

## 2019-10-05 MED ORDER — GUAIFENESIN-DM 100-10 MG/5ML PO SYRP
10.0000 mL | ORAL_SOLUTION | ORAL | Status: DC | PRN
  Administered 2019-10-05: 10 mL via ORAL
  Filled 2019-10-05: qty 10

## 2019-10-05 MED ORDER — ENOXAPARIN SODIUM 40 MG/0.4ML ~~LOC~~ SOLN: 40.0000 mg | SUBCUTANEOUS | Status: DC

## 2019-10-05 MED ORDER — VITAMIN C 500 MG PO TABS
500.0000 mg | ORAL_TABLET | Freq: Every day | ORAL | Status: DC
  Administered 2019-10-05 – 2019-10-07 (×2): 500 mg via ORAL
  Filled 2019-10-05 (×2): qty 1

## 2019-10-05 MED ORDER — ASPIRIN 81 MG PO CHEW
81.0000 mg | CHEWABLE_TABLET | Freq: Every day | ORAL | Status: DC
  Administered 2019-10-07: 81 mg via ORAL
  Filled 2019-10-05: qty 1

## 2019-10-05 MED ORDER — HYDROCOD POLST-CPM POLST ER 10-8 MG/5ML PO SUER
5.0000 mL | Freq: Two times a day (BID) | ORAL | Status: DC | PRN
  Filled 2019-10-05: qty 5

## 2019-10-05 MED ORDER — DEXAMETHASONE SODIUM PHOSPHATE 10 MG/ML IJ SOLN
6.0000 mg | INTRAMUSCULAR | Status: DC
  Administered 2019-10-05 – 2019-10-07 (×3): 6 mg via INTRAVENOUS
  Filled 2019-10-05 (×3): qty 1

## 2019-10-05 MED ORDER — ZINC SULFATE 220 (50 ZN) MG PO CAPS
220.0000 mg | ORAL_CAPSULE | Freq: Every day | ORAL | Status: DC
  Administered 2019-10-05 – 2019-10-07 (×2): 220 mg via ORAL
  Filled 2019-10-05 (×2): qty 1

## 2019-10-05 MED ORDER — HEPARIN BOLUS VIA INFUSION
2400.0000 [IU] | Freq: Once | INTRAVENOUS | Status: AC
  Administered 2019-10-05: 2400 [IU] via INTRAVENOUS
  Filled 2019-10-05: qty 2400

## 2019-10-05 MED ORDER — METOPROLOL TARTRATE 5 MG/5ML IV SOLN
5.0000 mg | INTRAVENOUS | Status: DC | PRN
  Administered 2019-10-06 – 2019-10-07 (×4): 5 mg via INTRAVENOUS
  Filled 2019-10-05 (×4): qty 5

## 2019-10-05 NOTE — ED Provider Notes (Signed)
[facility]-EMERGENCY DEPT Provider Note   CSN: [id number] Arrival date & time: 30-Oct-2019  1324     History   Chief Complaint Chief Complaint  Patient presents with  . COVID +    HPI [name]. is a 85 y.o. male.     Level 5 caveat due to altered mental status at baseline.  Patient positive for coronavirus at nursing home.  Increased lethargy.  Recent positive test yesterday.  Tested positive about a week or so.  Altered at baseline but less responsive at this time.  The history is provided by the EMS personnel, a caregiver and the nursing home.  Weakness Severity:  Mild Onset quality:  Gradual Timing:  Constant Progression:  Worsening Chronicity:  New Context: recent infection (COVID positive with increasing lethargy)   Relieved by:  Nothing Worsened by:  Nothing   Past Medical History:  Diagnosis Date  . Acute CVA (cerebrovascular accident) (HCC)    subacute  . Chronic back pain   . PAF (paroxysmal atrial fibrillation) [facility])     Patient Active Problem List   Diagnosis Date Noted  . Dementia, multiinfarct, with behavioral disturbance (HCC) 07/08/2018    History reviewed. No pertinent surgical history.      Home Medications    Prior to Admission medications   Medication Sig Start Date End Date Taking? Authorizing Provider  acetaminophen (TYLENOL) 500 MG tablet Take 500 mg by mouth every 4 (four) hours as needed for mild pain.    [provider]  aspirin 81 MG chewable tablet Chew 81 mg by mouth daily.    [provider]  busPIRone (BUSPAR) 10 MG tablet Take 10 mg by mouth 3 (three) times daily. 06/15/19   [provider]  divalproex (DEPAKOTE SPRINKLE) 125 MG capsule Take 2 capsules (250 mg total) by mouth 3 (three) times daily. Patient not taking: Reported on 07/13/2019 09/07/18 10/07/18  Sharman Cheek, MD  donepezil (ARICEPT) 10 MG tablet Take 1 tablet (10 mg total) by mouth at bedtime. Patient  not taking: Reported on 07/13/2019 09/07/18   Sharman Cheek, MD  furosemide (LASIX) 20 MG tablet Take 20 mg by mouth daily. 07/04/19   [provider]  loperamide (IMODIUM) 2 MG capsule Take 2 mg by mouth every 4 (four) hours as needed for diarrhea or loose stools.  04/24/19   [provider]  LORazepam (ATIVAN) 0.5 MG tablet Take 0.5 mg by mouth 2 (two) times daily. 07/05/19   [provider]  LORazepam (ATIVAN) 0.5 MG tablet Take 0.5 mg by mouth 2 (two) times daily as needed for anxiety.    [provider]  memantine (NAMENDA) 10 MG tablet Take 1 tablet (10 mg total) by mouth 2 (two) times daily. 09/07/18 10/07/18  Sharman Cheek, MD  QUEtiapine (SEROQUEL) 100 MG tablet Take 1 tablet (100 mg total) by mouth 2 (two) times daily. Patient not taking: Reported on 07/13/2019 09/07/18 10/07/18  Sharman Cheek, MD  risperiDONE (RISPERDAL) 0.5 MG tablet Take 1 tablet (0.5 mg total) by mouth 2 (two) times daily. 09/07/18 07/13/19  Sharman Cheek, MD  risperiDONE (RISPERDAL) 0.5 MG tablet Take 0.5 mg by mouth every 6 (six) hours as needed (Anxiety).    [provider]  Skin Protectants, Misc. (BAZA PROTECT EX) Apply 1 application topically 3 (three) times daily.    [provider]  XARELTO 20 MG TABS tablet Take 1 tablet (20 mg total) by mouth daily. Patient not taking: Reported on 07/13/2019 09/07/18  10/07/18  Sharman Cheek, MD    Family History Family History  Problem Relation Age of Onset  . Hypertension Other        family history    Social History Social History   Tobacco Use  . Smoking status: Unknown If Ever Smoked  Substance Use Topics  . Alcohol use: Not Currently  . Drug use: Not on file     Allergies   Patient has no known allergies.   Review of Systems Review of Systems  Unable to perform ROS: Dementia  Neurological: Positive for weakness.     Physical Exam Updated Vital Signs BP 118/88   Pulse (!) 111    Temp 97.9 F (36.6 C) (Rectal)   Resp 16   SpO2 97%   Physical Exam Vitals signs and nursing note reviewed.  Constitutional:      General: [name] is not in acute distress.    Appearance: [name] is well-developed. [name] is ill-appearing.  HENT:     Head: Normocephalic and atraumatic.     Mouth/Throat:     Mouth: Mucous membranes are dry.  Eyes:     Extraocular Movements: Extraocular movements intact.     Conjunctiva/sclera: Conjunctivae normal.     Pupils: Pupils are equal, round, and reactive to light.  Neck:     Musculoskeletal: Neck supple.  Cardiovascular:     Rate and Rhythm: Normal rate and regular rhythm.     Pulses: Normal pulses.     Heart sounds: Normal heart sounds. No murmur.  Pulmonary:     Effort: Pulmonary effort is normal. No respiratory distress.     Breath sounds: Normal breath sounds.  Abdominal:     Palpations: Abdomen is soft.     Tenderness: There is no abdominal tenderness.  Musculoskeletal:        General: No tenderness.  Skin:    General: Skin is warm and dry.  Neurological:     Mental Status: [name] is alert.     Comments: Arouses to voice but unable to participate in examination      ED Treatments / Results  Labs (all labs ordered are listed, but only abnormal results are displayed) Labs Reviewed  CBC WITH DIFFERENTIAL/PLATELET - Abnormal; Notable for the following components:      Result Value   Platelets 141 (*)    All other components within normal limits  COMPREHENSIVE METABOLIC PANEL - Abnormal; Notable for the following components:   Glucose, Bld 116 (*)    Calcium 8.5 (*)    Albumin 3.4 (*)    AST 123 (*)    ALT 60 (*)    All other components within normal limits  D-DIMER, QUANTITATIVE (NOT AT [facility]) - Abnormal; Notable for the following components:   D-Dimer, Quant 19.06 (*)    All other components within normal limits  LACTATE DEHYDROGENASE - Abnormal; Notable for the following components:   LDH 403 (*)    All other components within  normal limits  FIBRINOGEN - Abnormal; Notable for the following components:   Fibrinogen 529 (*)    All other components within normal limits  C-REACTIVE PROTEIN - Abnormal; Notable for the following components:   CRP 7.1 (*)    All other components within normal limits  POC SARS CORONAVIRUS 2 AG -  ED - Abnormal; Notable for the following components:   SARS Coronavirus 2 Ag POSITIVE (*)    All other components within normal limits  CULTURE, BLOOD (ROUTINE X 2)  CULTURE, BLOOD (  ROUTINE X 2)  URINE CULTURE  LACTIC ACID, PLASMA  PROCALCITONIN  FERRITIN  TRIGLYCERIDES  LACTIC ACID, PLASMA  URINALYSIS, ROUTINE W REFLEX MICROSCOPIC    EKG None  Radiology Dg Chest Port 1 View  Result Date: 07/02/2019 CLINICAL DATA:  COVID-19 positive EXAM: PORTABLE CHEST 1 VIEW COMPARISON:  Radiograph July 13, 2019 FINDINGS: Streaky basilar opacities may reflect atelectasis or early infection. No pneumothorax or effusion. Stable cardiomediastinal contours accounting for portable technique and patient rotation. No acute osseous or soft tissue abnormality. Degenerative changes are present in the imaged spine and shoulders. IMPRESSION: 1. Streaky basilar opacity, may reflect atelectasis or early infection. Electronically Signed   By: Kreg ShropshirePrice  DeHay M.D.   On: 07/02/2019 14:35    Procedures Procedures (including critical care time)  Medications Ordered in ED Medications - No data to display   Initial Impression / Assessment and Plan / ED Course  I have reviewed the triage vital signs and the nursing notes.  Pertinent labs & imaging results that were available during my care of the patient were reviewed by me and considered in my medical decision making (see chart for details).     Alejandro Salvodward A Dorff Jr. is an 85 year old male with history of paroxysmal atrial fibrillation, stroke, dementia, current Covid diagnosis [name] presents the ED with increased lethargy, altered mental status.  Patient lives at  skilled nursing facility.  Has baseline confusion but appears more lethargic and less responsive than normal.  Had positive coronavirus test last week and yesterday.  Has had less of an appetite.  Overall patient is difficult to assess as [name] appears lethargic, opens his eyes and groans to voice.  Nursing staff states usually [name] can answer yes or no at times but is fairly nonambulatory at baseline especially over the last week or so.  It is becoming more difficult to get patient to eat or drink.  No fevers or cough.  Patient overall with unremarkable vitals, no fever.  No hypoxia.  [name] appears chronically ill.  Appears to be different than his baseline.  Unable to fully participate in neurological exam.  Will pursue infectious/Covid work-up including head CT.  Patient with bibasilar opacities/possible early infection on chest x-ray.  Inflammatory markers elevated including fibrinogen, LDH, D-dimer elevated at 19.  Covid is positive as well.  Patient appears to be worsening from mental status standpoint.  Has had some paroxysmal atrial fibrillation as well.  Likely in the setting of coronavirus.  Given his elevation in inflammatory markers and change in his mental status will get a CT scan of his head and CT scan to rule out PE in addition to further evaluate for infectious process in his lungs.  According to nursing home patient is still full code.  I have been unable to get in touch with family.  Will admit for further care.  Alejandro SalvoEdward A Schmale Jr. was evaluated in Emergency Department on 07/02/2019 for the symptoms described in the history of present illness. [name] was evaluated in the context of the global COVID-19 pandemic, which necessitated consideration that the patient might be at risk for infection with the SARS-CoV-2 virus that causes COVID-19. Institutional protocols and algorithms that pertain to the evaluation of patients at risk for COVID-19 are in a state of rapid change based on information released by  regulatory bodies including the CDC and federal and state organizations. These policies and algorithms were followed during the patient's care in the ED.  This chart was dictated using voice recognition software.  Despite best efforts to proofread,  errors can occur which can change the documentation meaning.    Final Clinical Impressions(s) / ED Diagnoses   Final diagnoses:  COVID-19  Altered mental status, unspecified altered mental status type    ED Discharge Orders    None       Lennice Sites, DO 10/11/2019 1638

## 2019-10-05 NOTE — Progress Notes (Signed)
ANTICOAGULATION CONSULT NOTE - Initial Consult  Pharmacy Consult for heparin Indication: pulmonary embolus  No Known Allergies  Patient Measurements: Weight: 185 lb 6.5 oz (84.1 kg) Heparin Dosing Weight = TBW = 80 kg (per report from ED RN)  Vital Signs: Temp: 99.3 F (37.4 C) (11/19 2042) Temp Source: Rectal (11/19 1448) BP: 117/73 (11/19 2042) Pulse Rate: 84 (11/19 2042)  Labs: Recent Labs    09/27/2019 1448 10/16/2019 1500  HGB 16.2  --   HCT 49.9  --   PLT 141*  --   APTT  --  36  LABPROT  --  12.7  INR  --  1.0  CREATININE 0.80  --     Estimated Creatinine Clearance: 76.8 mL/min (by C-G formula based on SCr of 0.8 mg/dL).   Medical History: Past Medical History:  Diagnosis Date  . Acute CVA (cerebrovascular accident) ([place])    subacute  . Chronic back pain   . PAF (paroxysmal atrial fibrillation) [facility])     Assessment: Pharmacy consulted to dose/monitor heparin in this 85 year old male. Patient has PMH significant for CVA, afib, dementia. Pt was not taking anticoagulants PTA per med rec/MAR. Patient is COVID+, CT chest positive for acute bilateral PE with CT evidence of right heart strain consistent with submassive PE.   Baseline labs:  Hgb 16.2  Plt 141  D-dimer: 19.06  INR: 1  Today, 09/30/2019  SCr 0.9, CrCl ~ 70 mL/min  Hgb WNL, D-dimer elevated  Goal of Therapy:  Heparin level 0.3-0.7 units/ml Monitor platelets by anticoagulation protocol: Yes   Plan:   Give 2400 units bolus x 1  Start heparin infusion at 1300 units/hr  Check anti-Xa level in 8 hours and daily while on heparin  Continue to monitor H&H and platelets  Monitor for signs/symptoms of bleeding  Lenis Noon, PharmD 10/07/2019,8:58 PM

## 2019-10-05 NOTE — ED Triage Notes (Signed)
Pt arrives [facility] from [facility]. Pt tested positive for COVID last week and was retested 2 days ago and remains positive. Facility reports the pt is more lethargic than normal. No SHOB noted. Pt has dementia at baseline.

## 2019-10-05 NOTE — H&P (Addendum)
History and Physical        Hospital Admission Note Date: 10-09-19  Patient name: [name]. Medical record number: [id number] Date of birth: Dec 05, 1933 Age: 85 y.o. Gender: male  PCP: Patient, No Pcp Per    Patient coming from: [place]  I have reviewed all records in the [facility].    Chief Complaint:  COVID-19, more lethargic  HPI: Patient is a 85 year old male with history of CVA, paroxysmal atrial fibrillation, dementia, currently resides in [facility] skilled nursing facility.  Patient was tested positive for Covid last week and then he was retested again 2 days prior to admission and remained positive.  Facility noted that patient was much more lethargic than his usual, has dementia at baseline.  He was sent to the ER for further work-up. At the time of my examination, patient opens eyes with sternal rub but does not respond to any questions.  No ongoing respiratory distress, vomiting or diarrhea.  ED work-up/course:  In ED, temp 98.6, respiratory rate 21, pulse 97, BP 110/98, O2 sats 97% on room air Metabolic panel showed AST elevated 123, ALT 60, LDH 403, ferritin 322, CRP 7.1 Procalcitonin less than 0.1 D-dimer 19.06, fibrinogen 529 COVID-19 test positive  Review of Systems: Positives marked in 'bold' Unable to obtain review of system from the patient due to advanced dementia  Past Medical History: Past Medical History:  Diagnosis Date  . Acute CVA (cerebrovascular accident) ([place])    subacute  . Chronic back pain   . PAF (paroxysmal atrial fibrillation) ([place])     History reviewed. No pertinent surgical history.  Medications: Prior to Admission medications   Medication Sig Start Date End Date Taking? Authorizing Provider  acetaminophen (TYLENOL) 500 MG tablet Take 500 mg by mouth every 4 (four)  hours as needed for mild pain.   Yes [provider]  aspirin 81 MG chewable tablet Chew 81 mg by mouth daily.   Yes [provider]  busPIRone (BUSPAR) 10 MG tablet Take 10 mg by mouth 3 (three) times daily. 06/15/19  Yes [provider]  cefdinir (OMNICEF) 300 MG capsule Take 300 mg by mouth 2 (two) times daily. Ends 11.20.2020 09/26/19  Yes [provider]  furosemide (LASIX) 20 MG tablet Take 20 mg by mouth daily. 07/04/19  Yes [provider]  loperamide (IMODIUM) 2 MG capsule Take 2 mg by mouth every 4 (four) hours as needed for diarrhea or loose stools.  04/24/19  Yes [provider]  LORazepam (ATIVAN) 0.5 MG tablet Take 0.5 mg by mouth 2 (two) times daily. 07/05/19  Yes [provider]  risperiDONE (RISPERDAL) 0.5 MG tablet Take 0.5 mg by mouth 2 (two) times daily.    Yes [provider]  Skin Protectants, Misc. (BAZA PROTECT EX) Apply 1 application topically 3 (three) times daily.   Yes [provider]  divalproex (DEPAKOTE SPRINKLE) 125 MG capsule Take 2 capsules (250 mg total) by mouth 3 (three) times daily. Patient not taking: Reported on 07/13/2019 09/07/18 10/07/18  Carrie Mew, MD  donepezil (ARICEPT) 10 MG tablet Take 1 tablet (10 mg total) by mouth at  bedtime. Patient not taking: Reported on 07/13/2019 09/07/18   Sharman CheekStafford, Phillip, MD  memantine (NAMENDA) 10 MG tablet Take 1 tablet (10 mg total) by mouth 2 (two) times daily. 09/07/18 10/07/18  Sharman CheekStafford, Phillip, MD  QUEtiapine (SEROQUEL) 100 MG tablet Take 1 tablet (100 mg total) by mouth 2 (two) times daily. Patient not taking: Reported on 07/13/2019 09/07/18 10/07/18  Sharman CheekStafford, Phillip, MD  risperiDONE (RISPERDAL) 0.5 MG tablet Take 1 tablet (0.5 mg total) by mouth 2 (two) times daily. 09/07/18 07/13/19  Sharman CheekStafford, Phillip, MD  XARELTO 20 MG TABS tablet Take 1 tablet (20 mg total) by mouth daily. Patient not taking: Reported on 07/13/2019 09/07/18  10/07/18  Sharman CheekStafford, Phillip, MD    Allergies:  No Known Allergies  Social History:  reports previous alcohol use. No history on file for tobacco and drug.  Family History: Family History  Problem Relation Age of Onset  . Hypertension Other        family history    Physical Exam: Blood pressure (!) 145/68, pulse (!) 118, temperature 97.9 F (36.6 C), temperature source Rectal, resp. rate 19, SpO2 96 %. General: Opens eyes with sternal rub, does not respond to any questions, somnolent Eyes: pink conjunctiva,anicteric sclera, pupils equal and reactive to light and accomodation, HEENT: normocephalic, atraumatic, oropharynx clear Neck: supple, no masses or lymphadenopathy, no goiter, no bruits, no JVD CVS: Regular rate and rhythm, without murmurs, rubs or gallops. No lower extremity edema Resp : Decreased breath sound at the bases GI : Soft, nontender, nondistended, positive bowel sounds, no masses. No hepatomegaly. No hernia.  Musculoskeletal: No clubbing or cyanosis, positive pedal pulses. No contracture. ROM intact  Neuro: Unable to assess, does not follow commands Psych: Lethargic Skin: no rashes or lesions, warm and dry   LABS on Admission: I have personally reviewed all the labs and imagings below    Basic Metabolic Panel: Recent Labs  Lab 09/17/2019 1448  NA 140  K 4.2  CL 101  CO2 26  GLUCOSE 116*  BUN 18  CREATININE 0.80  CALCIUM 8.5*   Liver Function Tests: Recent Labs  Lab 09/30/2019 1448  AST 123*  ALT 60*  ALKPHOS 66  BILITOT 0.8  PROT 6.7  ALBUMIN 3.4*   No results for input(s): LIPASE, AMYLASE in the last 168 hours. No results for input(s): AMMONIA in the last 168 hours. CBC: Recent Labs  Lab 10/09/2019 1448  WBC 5.9  NEUTROABS 4.7  HGB 16.2  HCT 49.9  MCV 87.1  PLT 141*   Cardiac Enzymes: No results for input(s): CKTOTAL, CKMB, CKMBINDEX, TROPONINI in the last 168 hours. BNP: Invalid input(s): POCBNP CBG: No results for input(s): GLUCAP  in the last 168 hours.  Radiological Exams on Admission:  Dg Chest Port 1 View  Result Date: 10/01/2019 CLINICAL DATA:  COVID-19 positive EXAM: PORTABLE CHEST 1 VIEW COMPARISON:  Radiograph July 13, 2019 FINDINGS: Streaky basilar opacities may reflect atelectasis or early infection. No pneumothorax or effusion. Stable cardiomediastinal contours accounting for portable technique and patient rotation. No acute osseous or soft tissue abnormality. Degenerative changes are present in the imaged spine and shoulders. IMPRESSION: 1. Streaky basilar opacity, may reflect atelectasis or early infection. Electronically Signed   By: Kreg ShropshirePrice  DeHay M.D.   On: 10/08/2019 14:35      EKG: Independently reviewed.  Rate 63, normal sinus rhythm, right bundle branch block   Assessment/Plan Principal Problem:   COVID-19 virus infection -Chest x-ray showed basilar opacity, atelectasis or early infection, elevated inflammatory  markers -Currently no acute respiratory distress or hypoxia -Elevated D-dimer 19.06, will obtain CT angiogram of the chest to rule out pulmonary embolism -Placed on IV dexamethasone, if patient starts fevers or hypoxia, will place on IV  remdesivir per protocol -Continue antitussives, inhalers, supportive treatment, vitamins  Active Problems: Acute encephalopathy superimposed on dementia, multiinfarct, with behavioral disturbance (HCC) -Obtain UA and culture to rule out UTI - CT head showed prior infarct on the right at the temporal occipital junction, patchy small vessel disease in the periventricular white matter and in the basilar perforator distribution, no acute infarct -Patient is currently resident of skilled nursing facility and has advanced dementia -Continue Namenda - will need to address goals of care  DVT prophylaxis: Lovenox  CODE STATUS: Full CODE STATUS  Consults called: None  Family Communication: No family member at the bedside, patient was sent from skilled  nursing facility.  Called patient's son Carmeron Heady however he did not pick up the phone Admission status: Inpatient, telemetry  The medical decision making on this patient was of high complexity and the patient is at high risk for clinical deterioration, therefore this is a level 3 admission.  Severity of Illness:     The appropriate patient status for this patient is INPATIENT. Inpatient status is judged to be reasonable and necessary in order to provide the required intensity of service to ensure the patient's safety. The patient's presenting symptoms, physical exam findings, and initial radiographic and laboratory data in the context of their chronic comorbidities is felt to place them at high risk for further clinical deterioration. Furthermore, it is not anticipated that the patient will be medically stable for discharge from the hospital within 2 midnights of admission. The following factors support the patient status of inpatient.   " The patient's presenting symptoms include acute encephalopathy in the setting of active COVID-19 infection " The worrisome physical exam findings include somnolent " The initial radiographic and laboratory data are worrisome because of elevated inflammatory markers, COVID-19 positive " The chronic co-morbidities include advanced dementia   * I certify that at the point of admission it is my clinical judgment that the patient will require inpatient hospital care spanning beyond 2 midnights from the point of admission due to high intensity of service, high risk for further deterioration and high frequency of surveillance required.*    Time Spent on Admission: 60-minute     Adelynn Gipe M.D. [facility] 10/04/2019, 5:56 PM

## 2019-10-05 NOTE — ED Provider Notes (Signed)
Blood pressure (!) 145/68, pulse (!) 118, temperature 97.9 F (36.6 C), temperature source Rectal, resp. rate 19, SpO2 96 %.  In short, [name]. is a 85 y.o. male with a chief complaint of COVID + .  Refer to the original H&P for additional details.  06:45 PM  Called by radiology to discuss the CT findings of massive PE. I have also paged the hospitalist assigned to the patient he was already admitted to make that team aware, Dr. Tana Coast.     Margette Fast, MD 09/20/2019 2133

## 2019-10-05 NOTE — ED Notes (Signed)
Condom cath placed on pt 

## 2019-10-05 NOTE — ED Notes (Signed)
Attempted IV access times 2. Unsuccessful  

## 2019-10-06 ENCOUNTER — Inpatient Hospital Stay (HOSPITAL_COMMUNITY): Payer: Medicare Other

## 2019-10-06 ENCOUNTER — Encounter (HOSPITAL_COMMUNITY): Payer: Self-pay

## 2019-10-06 DIAGNOSIS — I2699 Other pulmonary embolism without acute cor pulmonale: Secondary | ICD-10-CM

## 2019-10-06 DIAGNOSIS — Z7189 Other specified counseling: Secondary | ICD-10-CM

## 2019-10-06 DIAGNOSIS — J9600 Acute respiratory failure, unspecified whether with hypoxia or hypercapnia: Secondary | ICD-10-CM

## 2019-10-06 DIAGNOSIS — I2602 Saddle embolus of pulmonary artery with acute cor pulmonale: Secondary | ICD-10-CM | POA: Diagnosis not present

## 2019-10-06 DIAGNOSIS — Z515 Encounter for palliative care: Secondary | ICD-10-CM | POA: Diagnosis not present

## 2019-10-06 DIAGNOSIS — G9341 Metabolic encephalopathy: Secondary | ICD-10-CM

## 2019-10-06 DIAGNOSIS — U071 COVID-19: Principal | ICD-10-CM

## 2019-10-06 DIAGNOSIS — R4182 Altered mental status, unspecified: Secondary | ICD-10-CM | POA: Diagnosis not present

## 2019-10-06 LAB — COMPREHENSIVE METABOLIC PANEL
ALT: 61 U/L — ABNORMAL HIGH (ref 0–44)
AST: 110 U/L — ABNORMAL HIGH (ref 15–41)
Albumin: 3 g/dL — ABNORMAL LOW (ref 3.5–5.0)
Alkaline Phosphatase: 60 U/L (ref 38–126)
Anion gap: 11 (ref 5–15)
BUN: 17 mg/dL (ref 8–23)
CO2: 21 mmol/L — ABNORMAL LOW (ref 22–32)
Calcium: 8.3 mg/dL — ABNORMAL LOW (ref 8.9–10.3)
Chloride: 103 mmol/L (ref 98–111)
Creatinine, Ser: 0.76 mg/dL (ref 0.61–1.24)
GFR calc Af Amer: >60 mL/min (ref >60)
GFR calc non Af Amer: >60 mL/min (ref >60)
Glucose, Bld: 166 mg/dL — ABNORMAL HIGH (ref 70–99)
Potassium: 4.2 mmol/L (ref 3.5–5.1)
Sodium: 135 mmol/L (ref 135–145)
Total Bilirubin: 1.2 mg/dL (ref 0.3–1.2)
Total Protein: 6.2 g/dL — ABNORMAL LOW (ref 6.5–8.1)

## 2019-10-06 LAB — CBC WITH DIFFERENTIAL/PLATELET
Abs Immature Granulocytes: 0.03 10*3/uL (ref 0.00–0.07)
Basophils Absolute: 0 10*3/uL (ref 0.0–0.1)
Basophils Relative: 0 %
Eosinophils Absolute: 0 10*3/uL (ref 0.0–0.5)
Eosinophils Relative: 0 %
HCT: 47.3 % (ref 39.0–52.0)
Hemoglobin: 15.5 g/dL (ref 13.0–17.0)
Immature Granulocytes: 0 %
Lymphocytes Relative: 9 %
Lymphs Abs: 0.6 10*3/uL — ABNORMAL LOW (ref 0.7–4.0)
MCH: 27.8 pg (ref 26.0–34.0)
MCHC: 32.8 g/dL (ref 30.0–36.0)
MCV: 84.9 fL (ref 80.0–100.0)
Monocytes Absolute: 0.3 10*3/uL (ref 0.1–1.0)
Monocytes Relative: 5 %
Neutro Abs: 5.9 10*3/uL (ref 1.7–7.7)
Neutrophils Relative %: 86 %
Platelets: 149 10*3/uL — ABNORMAL LOW (ref 150–400)
RBC: 5.57 MIL/uL (ref 4.22–5.81)
RDW: 15.2 % (ref 11.5–15.5)
WBC: 6.8 10*3/uL (ref 4.0–10.5)
nRBC: 0 % (ref 0.0–0.2)

## 2019-10-06 LAB — ECHOCARDIOGRAM COMPLETE
Height: 72 in
Weight: 2966.51 oz

## 2019-10-06 LAB — BLOOD GAS, ARTERIAL
Acid-Base Excess: 0.8 mmol/L (ref 0.0–2.0)
Bicarbonate: 22.9 mmol/L (ref 20.0–28.0)
O2 Saturation: 95.4 %
Patient temperature: 98.6
pCO2 arterial: 31 mmHg — ABNORMAL LOW (ref 32.0–48.0)
pH, Arterial: 7.482 — ABNORMAL HIGH (ref 7.350–7.450)
pO2, Arterial: 77 mmHg — ABNORMAL LOW (ref 83.0–108.0)

## 2019-10-06 LAB — D-DIMER, QUANTITATIVE: D-Dimer, Quant: 8.95 ug/mL-FEU — ABNORMAL HIGH (ref 0.00–0.50)

## 2019-10-06 LAB — HEPARIN LEVEL (UNFRACTIONATED)
Heparin Unfractionated: 0.51 IU/mL (ref 0.30–0.70)
Heparin Unfractionated: 0.47 IU/mL (ref 0.30–0.70)

## 2019-10-06 LAB — C-REACTIVE PROTEIN: CRP: 9.4 mg/dL — ABNORMAL HIGH (ref <1.0)

## 2019-10-06 LAB — FERRITIN: Ferritin: 393 ng/mL — ABNORMAL HIGH (ref 24–336)

## 2019-10-06 MED ORDER — ORAL CARE MOUTH RINSE
15.0000 mL | Freq: Two times a day (BID) | OROMUCOSAL | Status: DC
  Administered 2019-10-06 – 2019-10-07 (×4): 15 mL via OROMUCOSAL

## 2019-10-06 MED ORDER — SODIUM CHLORIDE 0.9 % IV SOLN
INTRAVENOUS | Status: DC | PRN
  Administered 2019-10-06: 1000 mL via INTRAVENOUS

## 2019-10-06 MED ORDER — SODIUM CHLORIDE 0.9 % IV SOLN
200.0000 mg | Freq: Once | INTRAVENOUS | Status: AC
  Administered 2019-10-06: 200 mg via INTRAVENOUS
  Filled 2019-10-06 (×3): qty 40

## 2019-10-06 MED ORDER — HALOPERIDOL LACTATE 5 MG/ML IJ SOLN: 1.0000 mg | INTRAMUSCULAR | Status: DC | PRN

## 2019-10-06 MED ORDER — FUROSEMIDE 10 MG/ML IJ SOLN
60.0000 mg | Freq: Once | INTRAMUSCULAR | Status: AC
  Administered 2019-10-06: 40 mg via INTRAVENOUS

## 2019-10-06 MED ORDER — SODIUM CHLORIDE 0.9 % IV SOLN
100.0000 mg | INTRAVENOUS | Status: DC
  Administered 2019-10-07: 100 mg via INTRAVENOUS
  Filled 2019-10-06 (×2): qty 20

## 2019-10-06 MED ORDER — FUROSEMIDE 10 MG/ML IJ SOLN
INTRAMUSCULAR | Status: AC
  Filled 2019-10-06: qty 4

## 2019-10-06 MED ORDER — MORPHINE SULFATE (PF) 2 MG/ML IV SOLN
1.0000 mg | INTRAVENOUS | Status: DC | PRN
  Administered 2019-10-08: 1 mg via INTRAVENOUS
  Filled 2019-10-06: qty 1

## 2019-10-06 NOTE — Progress Notes (Signed)
  Echocardiogram 2D Echocardiogram has been performed.  Hoorain Kozakiewicz G Pj Zehner 10/06/2019, 2:21 PM

## 2019-10-06 NOTE — Consult Note (Signed)
Consultation Note Date: 10/06/2019   Patient Name: [name].  DOB: Mar 01, 1934  MRN: [id number]  Age / Sex: 85 y.o., male  PCP: Patient, No Pcp Per Referring Physician: Mendel Corning, MD  Reason for Consultation: Establishing goals of care  HPI/Patient Profile: 85 y.o. male  with past medical history of CVA, a fib, dementia, living at SNF admitted on 10/03/2019 with increased lethargy.  He is covid positive and has subsequently been found to have acute pulmonary embolism.  Palliative consulted for [place].   Clinical Assessment and Goals of Care: Palliative care consult received.  Chart reviewed including personal review of pertinent labs and imaging.  Dicussed case with bedside RN and Dr. Tana Coast.  [name] is not able to participate in conversation regarding [place].  I called and was able to reach his sons, [name]) and [name].    We discussed clinical course as well as wishes moving forward in care plan for their father.  Discussed that he is critically ill with COVID infection and massive PE.  Concepts specific to code status and care plan this hospitalization.  We discussed difference between a aggressive medical intervention path and a palliative, comfort focused care path.  Values and goals of care important to patient and family were attempted to be elicited.  We discussed that in light of multiple chronic medical problems that have worsened with this acute problem, care should be focused on interventions that are likely to allow the patient to achieve goal of getting back to home and spending time with family. I discussed with family regarding heroic interventions at the end-of-life and they agree this would not be in line with prior expressed wishes for a natural death or be likely to lead to getting well enough to go back home. They were in agreement with changing CODE STATUS to DO NOT  RESUSCITATE.   SUMMARY OF RECOMMENDATIONS   - DNR/DNI - Continue current interventions and reassess situation in 24 hours.  His sons are clear that they do not want him to suffer and that he is critically ill.  - Placed orders for medications as needed for symptom management.  Sons would like to be updated if his situation changes, however, medications for his symptomatic relief should not be withheld if needed for his comfort.  Code Status/Advance Care Planning:  DNR   Symptom Management:   SOB: morphine as needed  Agitation: haldol as needed  Palliative Prophylaxis:   Frequent Pain Assessment  Psycho-social/Spiritual:   Desire for further Chaplaincy support:yes   Prognosis:   Poor  Discharge Planning: To Be Determined      Primary Diagnoses: Present on Admission:  Acute respiratory failure due to COVID-19 ([facility])  Dementia, multiinfarct, with behavioral disturbance ([place])  Pulmonary embolism, bilateral ([place])  Acute metabolic encephalopathy   I have reviewed the medical record, interviewed the patient and family, and examined the patient. The following aspects are pertinent.  Past Medical History:  Diagnosis Date   Acute CVA (cerebrovascular accident) ([place])  subacute   Chronic back pain    PAF (paroxysmal atrial fibrillation) ([facility])    Social History   Socioeconomic History   Marital status: Divorced    Spouse name: Not on file   Number of children: Not on file   Years of education: Not on file   Highest education level: Not on file  Occupational History   Not on file  Social Needs   Andrews resource strain: Not on file   Food insecurity    Worry: Not on file    Inability: Not on file   Transportation needs    Medical: Not on file    Non-medical: Not on file  Tobacco Use   Smoking status: Never Smoker   Smokeless tobacco: Never Used  Substance and Sexual Activity   Alcohol use: Not Currently   Drug use: Not on file    Sexual activity: Not on file  Lifestyle   Physical activity    Days per week: Not on file    Minutes per session: Not on file   Stress: Not on file  Relationships   Social connections    Talks on phone: Not on file    Gets together: Not on file    Attends religious service: Not on file    Active member of club or organization: Not on file    Attends meetings of clubs or organizations: Not on file    Relationship status: Not on file  Other Topics Concern   Not on file  Social History Narrative   Not on file   Family History  Problem Relation Age of Onset   Hypertension Other        family history   Scheduled Meds:  aspirin  81 mg Oral Daily   dexamethasone (DECADRON) injection  6 mg Intravenous Q24H   furosemide       Ipratropium-Albuterol  1 puff Inhalation Q6H   mouth rinse  15 mL Mouth Rinse BID   memantine  10 mg Oral BID   vitamin C  500 mg Oral Daily   zinc sulfate  220 mg Oral Daily   Continuous Infusions:  sodium chloride 1,000 mL (10/06/19 1002)   heparin 1,300 Units/hr (10/06/19 1149)   [START ON 10/07/2019] remdesivir 100 mg in NS 250 mL     PRN Meds:.sodium chloride, chlorpheniramine-HYDROcodone, guaiFENesin-dextromethorphan, haloperidol lactate, metoprolol tartrate, morphine injection Medications Prior to Admission:  Prior to Admission medications   Medication Sig Start Date End Date Taking? Authorizing Provider  acetaminophen (TYLENOL) 500 MG tablet Take 500 mg by mouth every 4 (four) hours as needed for mild pain.   Yes [provider]  aspirin 81 MG chewable tablet Chew 81 mg by mouth daily.   Yes [provider]  busPIRone (BUSPAR) 10 MG tablet Take 10 mg by mouth 3 (three) times daily. 06/15/19  Yes [provider]  cefdinir (OMNICEF) 300 MG capsule Take 300 mg by mouth 2 (two) times daily. Ends 11.20.2020 09/26/19  Yes [provider]  furosemide (LASIX) 20 MG tablet Take 20 mg by mouth daily. 07/04/19   Yes [provider]  loperamide (IMODIUM) 2 MG capsule Take 2 mg by mouth every 4 (four) hours as needed for diarrhea or loose stools.  04/24/19  Yes [provider]  LORazepam (ATIVAN) 0.5 MG tablet Take 0.5 mg by mouth 2 (two) times daily. 07/05/19  Yes [provider]  risperiDONE (RISPERDAL) 0.5 MG tablet Take 0.5 mg by mouth 2 (two) times daily.  Yes [provider]  Skin Protectants, Misc. (BAZA PROTECT EX) Apply 1 application topically 3 (three) times daily.   Yes [provider]  divalproex (DEPAKOTE SPRINKLE) 125 MG capsule Take 2 capsules (250 mg total) by mouth 3 (three) times daily. Patient not taking: Reported on 07/13/2019 09/07/18 10/07/18  Sharman Cheek, MD  donepezil (ARICEPT) 10 MG tablet Take 1 tablet (10 mg total) by mouth at bedtime. Patient not taking: Reported on 07/13/2019 09/07/18   Sharman Cheek, MD  memantine (NAMENDA) 10 MG tablet Take 1 tablet (10 mg total) by mouth 2 (two) times daily. 09/07/18 10/07/18  Sharman Cheek, MD  QUEtiapine (SEROQUEL) 100 MG tablet Take 1 tablet (100 mg total) by mouth 2 (two) times daily. Patient not taking: Reported on 07/13/2019 09/07/18 10/07/18  Sharman Cheek, MD  risperiDONE (RISPERDAL) 0.5 MG tablet Take 1 tablet (0.5 mg total) by mouth 2 (two) times daily. 09/07/18 07/13/19  Sharman Cheek, MD  XARELTO 20 MG TABS tablet Take 1 tablet (20 mg total) by mouth daily. Patient not taking: Reported on 07/13/2019 09/07/18 10/07/18  Sharman Cheek, MD   No Known Allergies Review of Systems  Unable to assess  Physical Exam Limited physical exam.  Largely somnolent.  The above conversation was completed via telephone due to the visitor restrictions during the COVID-19 pandemic. Thorough chart review and discussion with necessary members of the care team was completed as part of assessment. All issues were discussed and addressed but no comprehensive physical exam was  performed.  Vital Signs: BP 99/60 (BP Location: Left Arm)    Pulse 93    Temp 97.9 F (36.6 C) (Axillary)    Resp 20    Ht 6' (1.829 m)    Wt 84.1 kg    SpO2 100%    BMI 25.15 kg/m  Pain Scale: PAINAD       SpO2: SpO2: 100 % O2 Device:SpO2: 100 % O2 Flow Rate: .O2 Flow Rate (L/min): 4 L/min  IO: Intake/output summary:   Intake/Output Summary (Last 24 hours) at 10/06/2019 1345 Last data filed at 10/06/2019 1000 Gross per 24 hour  Intake 199.2 ml  Output 850 ml  Net -650.8 ml    LBM: Last BM Date: (pt unable to verify ) Baseline Weight: Weight: 84.1 kg Most recent weight: Weight: 84.1 kg     Palliative Assessment/Data:   Flowsheet Rows     Most Recent Value  Intake Tab  Referral Department  Hospitalist  Unit at Time of Referral  Med/Surg Unit  Palliative Care Primary Diagnosis  Sepsis/Infectious Disease  Date Notified  10/06/19  Palliative Care Type  New Palliative care  Reason for referral  Clarify Goals of Care  Date of Admission  10/12/19  Date first seen by Palliative Care  10/06/19  # of days Palliative referral response time  0 Day(s)  # of days IP prior to Palliative referral  1  Clinical Assessment  Palliative Performance Scale Score  10%  Psychosocial & Spiritual Assessment  Palliative Care Outcomes  Patient/Family meeting held?  Yes  Who was at the meeting?  Sons via phone      Time In: 1025 Time Out: 1120 Time Total: 55 Greater than 50%  of this time was spent counseling and coordinating care related to the above assessment and plan.  Signed by: Romie Minus, MD   Please contact Palliative Medicine Team phone at [phone] for questions and concerns.  For individual provider: See Loretha Stapler

## 2019-10-06 NOTE — Progress Notes (Signed)
[facility] for heparin Indication: pulmonary embolus  No Known Allergies  Patient Measurements: Height: 6' (182.9 cm) Weight: 185 lb 6.5 oz (84.1 kg) IBW/kg (Calculated) : 77.6 Heparin Dosing Weight = TBW = 80 kg (per report from ED RN)  Vital Signs: Temp: 97.9 F (36.6 C) (11/20 1029) Temp Source: Axillary (11/20 1029) BP: 99/60 (11/20 1029) Pulse Rate: 93 (11/20 1029)  Labs: Recent Labs    Oct 09, 2019 1448 09-Oct-2019 1500 2019-10-09 2018 10/06/19 0427 10/06/19 1311  HGB 16.2  --  16.8 15.5  --   HCT 49.9  --  50.6 47.3  --   PLT 141*  --  138* 149*  --   APTT  --  36  --   --   --   LABPROT  --  12.7  --   --   --   INR  --  1.0  --   --   --   HEPARINUNFRC  --   --   --  0.51 0.47  CREATININE 0.80  --  0.86 0.76  --     Estimated Creatinine Clearance: 76.8 mL/min (by C-G formula based on SCr of 0.76 mg/dL).  Assessment: Pharmacy consulted to dose/monitor heparin in this 85 year old male. Patient has PMH significant for CVA, afib, dementia. Pt was not taking anticoagulants PTA per med rec/MAR. Patient is COVID+, CT chest positive for acute bilateral PE with CT evidence of right heart strain consistent with submassive PE.   Baseline labs:  Hgb 16.2  Plt 141  D-dimer: 19.06  INR: 1  10/06/2019 Confirmatory heparin level therapeutic at 0.47 on rate of 1300 units/hr Hg stable, PLTC remains < 150.  No bleeding reported.   Goal of Therapy:  Heparin level 0.3-0.7 units/ml Monitor platelets by anticoagulation protocol: Yes   Plan: continue heparin infusion at 1300 units/hr daily heparin level & CBC while on heparin Monitor for signs/symptoms of bleeding  Eudelia Bunch, Pharm.D [phone] 10/06/2019 1:45 PM

## 2019-10-06 NOTE — Progress Notes (Signed)
Bilateral lower extremity venous duplex has been completed. Preliminary results can be found in CV Proc through chart review.  Results were given to the patient's nurse, Maudie Mercury.  10/06/19 2:16 PM Carlos Levering RVT

## 2019-10-06 NOTE — Progress Notes (Signed)
ANTICOAGULATION CONSULT NOTE - Follow Up Consult  Pharmacy Consult for Heparin Indication: PE  No Known Allergies  Patient Measurements: Weight: 185 lb 6.5 oz (84.1 kg) Heparin Dosing Weight:   Vital Signs: Temp: 98.3 F (36.8 C) (11/20 0053) Temp Source: Oral (11/20 0053) BP: 105/58 (11/20 0528) Pulse Rate: 66 (11/20 0528)  Labs: Recent Labs    10/30/19 1448 October 30, 2019 1500 Oct 30, 2019 2018 10/06/19 0427  HGB 16.2  --  16.8 15.5  HCT 49.9  --  50.6 47.3  PLT 141*  --  138* 149*  APTT  --  36  --   --   LABPROT  --  12.7  --   --   INR  --  1.0  --   --   HEPARINUNFRC  --   --   --  0.51  CREATININE 0.80  --  0.86 0.76    Estimated Creatinine Clearance: 76.8 mL/min (by C-G formula based on SCr of 0.76 mg/dL).   Medications:  Infusions:  . heparin 1,300 Units/hr (Oct 30, 2019 2021)  . remdesivir 200 mg in NS 250 mL     Followed by  . [START ON 10/07/2019] remdesivir 100 mg in NS 250 mL      Assessment: Patient with heparin level at goal.  No heparin issues noted.  Goal of Therapy:  Heparin level 0.3-0.7 units/ml Monitor platelets by anticoagulation protocol: Yes   Plan:  Continue heparin drip at current rate Recheck level at [address], [place] 10/06/2019,7:09 AM

## 2019-10-06 NOTE — Progress Notes (Signed)
SLP Cancellation Note  Patient Details Name: [name]. MRN: [id number] DOB: 1934-11-08   Cancelled treatment:       Reason Eval/Treat Not Completed: Medical issues which prohibited therapy. RN reports pt continues to be poorly responsive. Will hold on BSE at this time, continuing efforts when pt appropriate.  Carmela Rima, [place] Speech Language Pathologist Office: [phone] Pager: [phone]  Shonna Chock 10/06/2019, 10:23 AM

## 2019-10-06 NOTE — Progress Notes (Signed)
Pt's HR was in the 140s on the main cardiac monitor. Came into room to check on pt  and saw that pt was producing pink tinged froth from mouth, pulse oxygen was dropping to 88 on 2L nasal cannula with crackles heard on bilateral lungs. Suctioned pt and increased to 5L, pulse ox went up to 93. Rapid respond called and on-call provider notified.

## 2019-10-06 NOTE — Progress Notes (Addendum)
[facility]                                                                              Patient Demographics  [name], is a 85 y.o. male, DOB - Jul 16, 1934, FTD:[id number]  Admit date - 2019-10-21   Admitting Physician Chanelle Hodsdon Jenna Luo, MD  Outpatient Primary MD for the patient is Patient, No Pcp Per  Outpatient specialists:   LOS - 1  days   Medical records reviewed and are as summarized below:    Chief Complaint  Patient presents with  . COVID +       Brief summary   Patient is a 85 year old male with history of CVA, paroxysmal atrial fibrillation, dementia, currently resides in [facility] skilled nursing facility.  Patient was tested positive for Covid last week and then he was retested again 2 days prior to admission and remained positive.  Facility noted that patient was much more lethargic than his usual, has dementia at baseline.  He was sent to the ER for further work-up. At the time of my examination, patient opens eyes with sternal rub but does not respond to any questions.  No ongoing respiratory distress, vomiting or diarrhea.  D-dimer 19.06, fibrinogen 529, COVID-19 test positive CT angiogram of the chest showed bilateral pulmonary embolism with right heart strain, submassive PE  Assessment & Plan    Principal Problem:   1. Acute Hypoxic Resp. Failure due to Acute Covid 19 Viral Pneumonitis during the ongoing 2020 Covid 19 Pandemic - POA -Patient progressively hypoxic overnight, O2 sats 98% on 5 L, elevated inflammatory markers. -D-dimer 19.06 at the time of admission,  CT angiogram of the chest showed bilateral PE, placed on heparin drip - Patient is started on remdesivir, IV dexamethasone per protocol.  CRP trending up, hold off on Actemra until goals of care are addressed. - Continue to wean oxygen, ambulatory O2 screening daily as tolerated - Has + sick contact (from skilled nursing facility) -Continue inhalers, cough  suppressants, vitamins, supportive treatment - Continue to follow labs as below  Recent Labs  Lab 2019/10/21 1448 21-Oct-2019 1450 10-21-19 2018 10/06/19 0427  DDIMER 19.06*  --   --  8.95*  FERRITIN  --  322  --  393*  CRP  --  7.1*  --  9.4*  ALT 60*  --   --  61*  PROCALCITON <0.10  --  <0.10  --      Active Problems: Acute pulmonary embolism, bilateral (HCC) -Likely due to #1, CT angiogram of the chest showed bilateral PE with right heart strain -Continue IV heparin drip, not sure patient will be able to tolerate oral anticoagulation, barely responsive, has advanced dementia -Follow 2D echo, venous Dopplers of the lower extremities Addendum Venous Doppler L LE: acute DVT in the left femoral and popliteal vein.  Acute metabolic encephalopathy superimposed on dementia, multiinfarct, with behavioral disturbance (HCC), FTT -Patient has history of advanced dementia, CT head showed no acute infarct or bleeding -I was able to discuss with patient's son, [name], patient has dementia however was not able to assess baseline -Currently not following any commands, very somnolent and  lethargic, failure to thrive, high risk of mortality due to bilateral pulmonary embolism, submassive with right heart strain, acute respiratory failure with hypoxia due to Covid pneumonia, advanced age  Code Status: Currently full code DVT Prophylaxis:  Heparin gtt Family Communication: Discussed all imaging results, lab results, explained to the patient's son, explained his son about patient's clinical status and overall very poor prognosis, may not survive this hospitalization.  However due to very poor phone connection, had to call at least 4-5 times, I was not able to complete goals of care.  I have requested palliative medicine consult for urgent GOC.   Disposition Plan: Pending clinical status, currently inpatient given acute hypoxic respiratory failure, acute COVID-19 illness.     Time Spent in minutes   1  hour  Procedures:  CT angiogram of the chest  Consultants:   Palliative medicine  Antimicrobials:   Anti-infectives (From admission, onward)   Start     Dose/Rate Route Frequency Ordered Stop   10/07/19 1000  remdesivir 100 mg in sodium chloride 0.9 % 250 mL IVPB     100 mg 500 mL/hr over 30 Minutes Intravenous Every 24 hours 10/06/19 0701 10/11/19 0959   10/06/19 1000  remdesivir 200 mg in sodium chloride 0.9 % 250 mL IVPB     200 mg 500 mL/hr over 30 Minutes Intravenous Once 10/06/19 0701 10/06/19 1033         Medications  Scheduled Meds: . aspirin  81 mg Oral Daily  . dexamethasone (DECADRON) injection  6 mg Intravenous Q24H  . furosemide      . Ipratropium-Albuterol  1 puff Inhalation Q6H  . mouth rinse  15 mL Mouth Rinse BID  . memantine  10 mg Oral BID  . vitamin C  500 mg Oral Daily  . zinc sulfate  220 mg Oral Daily   Continuous Infusions: . sodium chloride 1,000 mL (10/06/19 1002)  . heparin 1,300 Units/hr (10/13/2019 2021)  . [START ON 10/07/2019] remdesivir 100 mg in NS 250 mL     PRN Meds:.sodium chloride, chlorpheniramine-HYDROcodone, guaiFENesin-dextromethorphan, metoprolol tartrate      Subjective:   [name] was seen and examined today.  Barely responsive, not following any verbal commands, has underlying advanced dementia.  Getting more hypoxic on 6 L O2.  Unable to obtain any review of system from the patient.  Not in acute distress or discomfort at the time of my examination.  Objective:   Vitals:   10/06/19 0522 10/06/19 0528 10/06/19 0814 10/06/19 1029  BP: 115/70 (!) 105/58  99/60  Pulse:  66  93  Resp:    20  Temp:    97.9 F (36.6 C)  TempSrc:    Axillary  SpO2:    98%  Weight:      Height:   6' (1.829 m)     Intake/Output Summary (Last 24 hours) at 10/06/2019 1049 Last data filed at 10/06/2019 1000 Gross per 24 hour  Intake 199.2 ml  Output 850 ml  Net -650.8 ml     Wt Readings from Last 3 Encounters:  10/04/2019  84.1 kg  07/13/19 80 kg  08/31/18 80.7 kg     Exam  General: Very lethargic, not following any commands  Eyes:   HEENT:  Atraumatic, normocephalic, normal oropharynx  Cardiovascular: S1 S2 auscultated,  RRR  Respiratory: Decreased breath sound at the bases  Gastrointestinal: Soft, nontender, nondistended, + bowel sounds  Ext: no pedal edema bilaterally  Neuro: Unable to assess  Musculoskeletal:  No digital cyanosis, clubbing  Skin: No rashes  Psych: Very lethargic, not following any commands, has underlying dementia   Data Reviewed:  I have personally reviewed following labs and imaging studies  Micro Results Recent Results (from the past 240 hour(s))  Blood Culture (routine x 2)     Status: None (Preliminary result)   Collection Time: 09/25/2019  3:30 PM   Specimen: BLOOD  Result Value Ref Range Status   Specimen Description   Final    BLOOD RIGHT ANTECUBITAL Performed at [facility], [address] [place], [place] [address]    Special Requests   Final    BOTTLES DRAWN AEROBIC AND ANAEROBIC Blood Culture adequate volume Performed at [facility], [address] [place], [place] [address]    Culture   Final    NO GROWTH < 24 HOURS Performed at [facility], [address] [place], [place] [address]    Report Status PENDING  Incomplete  Blood Culture (routine x 2)     Status: None (Preliminary result)   Collection Time: 09/19/2019  8:18 PM   Specimen: BLOOD LEFT ARM  Result Value Ref Range Status   Specimen Description   Final    BLOOD LEFT ARM Performed at [facility], [address] [place], [place] [address]    Special Requests   Final    BOTTLES DRAWN AEROBIC AND ANAEROBIC Blood Culture adequate volume Performed at [facility], [address] [place], [place] [address]    Culture   Final    NO GROWTH < 12 HOURS Performed at [facility], [address] [place],  [place] [address]    Report Status PENDING  Incomplete    Radiology Reports Ct Head Wo Contrast  Result Date: 09/18/2019 CLINICAL DATA:  Lethargy.  Altered mental status. EXAM: CT HEAD WITHOUT CONTRAST TECHNIQUE: Contiguous axial images were obtained from the base of the skull through the vertex without intravenous contrast. COMPARISON:  Brain CT 07/13/2019 FINDINGS: Brain: Ventricles and sulci are prominent compatible with atrophy. No evidence for acute cortically based infarct, intracranial hemorrhage, mass lesion or mass-effect. Focal encephalomalacia within the right periventricular white matter. 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 Chronic microvascular ischemic changes. Vascular: Unremarkable. Skull: Intact. Sinuses/Orbits: Paranasal sinuses are well aerated. Mastoid air cells are unremarkable. Other: None. IMPRESSION: No acute intracranial process. Atrophy and chronic microvascular ischemic changes. Electronically Signed   By: Annia Beltrew  Davis M.D.   On: 10/16/2019 18:39   Ct Angio Chest Pe W And/or Wo Contrast  Result Date: 10/04/2019 CLINICAL DATA:  Chest pain. Altered mental status. Coronavirus infection. EXAM: CT ANGIOGRAPHY CHEST WITH CONTRAST TECHNIQUE: Multidetector CT imaging of the chest was performed using the standard protocol during bolus administration of intravenous contrast. Multiplanar CT image reconstructions and MIPs were obtained to evaluate the vascular anatomy. CONTRAST:  100mL OMNIPAQUE IOHEXOL 350 MG/ML SOLN COMPARISON:  Chest radiography same day FINDINGS: Cardiovascular: Pulmonary opacification is excellent. The patient demonstrates extensive bilateral pulmonary emboli including saddle embolus. Heart size is normal but right ventricular to left ventricular ratio is abnormal at 1.2 and therefore suggests submassive disease with right ventricular strain. There is coronary artery calcification. There is ordinary aortic atherosclerosis.  Mediastinum/Nodes: No hilar or mediastinal mass or lymphadenopathy. Lungs/Pleura: Bibasilar pulmonary atelectasis. No identifiable pulmonary infiltrate. No mass lesion. Upper Abdomen: Negative Musculoskeletal: Ordinary degenerative changes affect the spine. Review of the MIP images confirms the  above findings. IMPRESSION: Positive for acute bilateral PE with CT evidence of right heart strain (RV/LV Ratio = 1.2) consistent with at least submassive (intermediate risk) PE. The presence of right heart strain has been associated with an increased risk of morbidity and mortality. Please activate Code PE by paging [phone]. Coronary artery calcification. Aortic atherosclerosis. Mild basilar atelectasis. Critical Value/emergent results were called by telephone at the time of interpretation on 2019-10-21 at 6:43 pm to providerDr.Long, Who verbally acknowledged these results. Electronically Signed   By: Paulina Fusi M.D.   On: October 21, 2019 18:45   Dg Chest Port 1 View  Result Date: 10/06/2019 CLINICAL DATA:  Tachypnea. Decreased oxygen saturation. COVID-19 positive. Diagnosed with pulmonary embolus yesterday. EXAM: PORTABLE CHEST 1 VIEW COMPARISON:  Radiograph and CT yesterday. FINDINGS: Heart is normal in size with normal mediastinal contours. Minor right lung base atelectasis. Improved left basilar atelectasis from prior radiograph no pulmonary edema, pleural effusion or pneumothorax. No acute osseous abnormalities. IMPRESSION: Improve left and unchanged right lung base atelectasis. No new abnormalities. Electronically Signed   By: Narda Rutherford M.D.   On: 10/06/2019 06:24   Dg Chest Port 1 View  Result Date: 10-21-19 CLINICAL DATA:  COVID-19 positive EXAM: PORTABLE CHEST 1 VIEW COMPARISON:  Radiograph July 13, 2019 FINDINGS: Streaky basilar opacities may reflect atelectasis or early infection. No pneumothorax or effusion. Stable cardiomediastinal contours accounting for portable technique and patient  rotation. No acute osseous or soft tissue abnormality. Degenerative changes are present in the imaged spine and shoulders. IMPRESSION: 1. Streaky basilar opacity, may reflect atelectasis or early infection. Electronically Signed   By: Kreg Shropshire M.D.   On: October 21, 2019 14:35    Lab Data:  CBC: Recent Labs  Lab 2019-10-21 1448 2019-10-21 2018 10/06/19 0427  WBC 5.9 6.4 6.8  NEUTROABS 4.7  --  5.9  HGB 16.2 16.8 15.5  HCT 49.9 50.6 47.3  MCV 87.1 85.6 84.9  PLT 141* 138* 149*   Basic Metabolic Panel: Recent Labs  Lab Oct 21, 2019 1448 10-21-2019 2018 10/06/19 0427  NA 140  --  135  K 4.2  --  4.2  CL 101  --  103  CO2 26  --  21*  GLUCOSE 116*  --  166*  BUN 18  --  17  CREATININE 0.80 0.86 0.76  CALCIUM 8.5*  --  8.3*   GFR: Estimated Creatinine Clearance: 76.8 mL/min (by C-G formula based on SCr of 0.76 mg/dL). Liver Function Tests: Recent Labs  Lab Oct 21, 2019 1448 10/06/19 0427  AST 123* 110*  ALT 60* 61*  ALKPHOS 66 60  BILITOT 0.8 1.2  PROT 6.7 6.2*  ALBUMIN 3.4* 3.0*   No results for input(s): LIPASE, AMYLASE in the last 168 hours. No results for input(s): AMMONIA in the last 168 hours. Coagulation Profile: Recent Labs  Lab 2019/10/21 1500  INR 1.0   Cardiac Enzymes: No results for input(s): CKTOTAL, CKMB, CKMBINDEX, TROPONINI in the last 168 hours. BNP (last 3 results) No results for input(s): PROBNP in the last 8760 hours. HbA1C: No results for input(s): HGBA1C in the last 72 hours. CBG: No results for input(s): GLUCAP in the last 168 hours. Lipid Profile: Recent Labs    October 21, 2019 1450  TRIG 89   Thyroid Function Tests: No results for input(s): TSH, T4TOTAL, FREET4, T3FREE, THYROIDAB in the last 72 hours. Anemia Panel: Recent Labs    10-21-19 1450 10/06/19 0427  FERRITIN 322 393*   Urine analysis:    Component Value Date/Time  COLORURINE YELLOW 10/07/2019 1926   APPEARANCEUR CLEAR 10/02/2019 1926   APPEARANCEUR Turbid 08/14/2012 1139    LABSPEC 1.025 10/12/2019 1926   LABSPEC 1.020 08/14/2012 1139   PHURINE 5.0 09/24/2019 1926   GLUCOSEU NEGATIVE 09/27/2019 1926   GLUCOSEU 50 mg/dL 08/14/2012 1139   HGBUR LARGE (A) 10/11/2019 1926   BILIRUBINUR NEGATIVE 10/07/2019 1926   BILIRUBINUR Negative 08/14/2012 1139   KETONESUR 5 (A) 10/04/2019 1926   PROTEINUR 100 (A) 10/13/2019 1926   UROBILINOGEN 0.2 05/04/2011 1135   NITRITE NEGATIVE 09/28/2019 1926   LEUKOCYTESUR NEGATIVE 09/26/2019 1926   LEUKOCYTESUR Negative 08/14/2012 1139     Sherisse Fullilove M.D. [facility] 10/06/2019, 10:49 AM   Call night coverage person covering after 7pm

## 2019-10-06 NOTE — Progress Notes (Addendum)
Assumed care of patient at this time. Patient is confused per report patient has dementia at baseline. Patient also lethargic. Per report, MD is aware of patient's lethargy as she has already rounded on the patient today. Agree with previously documented assessment. Will continue to monitor patient.   Zienna Ahlin, Fraser Din  10/06/2019

## 2019-10-07 ENCOUNTER — Inpatient Hospital Stay (HOSPITAL_COMMUNITY): Payer: Medicare Other

## 2019-10-07 DIAGNOSIS — R4182 Altered mental status, unspecified: Secondary | ICD-10-CM | POA: Diagnosis not present

## 2019-10-07 DIAGNOSIS — Z515 Encounter for palliative care: Secondary | ICD-10-CM | POA: Diagnosis not present

## 2019-10-07 DIAGNOSIS — G9341 Metabolic encephalopathy: Secondary | ICD-10-CM | POA: Diagnosis not present

## 2019-10-07 DIAGNOSIS — J9600 Acute respiratory failure, unspecified whether with hypoxia or hypercapnia: Secondary | ICD-10-CM | POA: Diagnosis not present

## 2019-10-07 DIAGNOSIS — U071 COVID-19: Secondary | ICD-10-CM | POA: Diagnosis not present

## 2019-10-07 LAB — CBC WITH DIFFERENTIAL/PLATELET
Abs Immature Granulocytes: 0.03 10*3/uL (ref 0.00–0.07)
Basophils Absolute: 0 10*3/uL (ref 0.0–0.1)
Basophils Relative: 0 %
Eosinophils Absolute: 0 10*3/uL (ref 0.0–0.5)
Eosinophils Relative: 0 %
HCT: 40.4 % (ref 39.0–52.0)
Hemoglobin: 13.3 g/dL (ref 13.0–17.0)
Immature Granulocytes: 0 %
Lymphocytes Relative: 10 %
Lymphs Abs: 0.8 10*3/uL (ref 0.7–4.0)
MCH: 28.4 pg (ref 26.0–34.0)
MCHC: 32.9 g/dL (ref 30.0–36.0)
MCV: 86.3 fL (ref 80.0–100.0)
Monocytes Absolute: 0.3 10*3/uL (ref 0.1–1.0)
Monocytes Relative: 3 %
Neutro Abs: 6.6 10*3/uL (ref 1.7–7.7)
Neutrophils Relative %: 87 %
Platelets: 167 10*3/uL (ref 150–400)
RBC: 4.68 MIL/uL (ref 4.22–5.81)
RDW: 15.4 % (ref 11.5–15.5)
WBC: 7.6 10*3/uL (ref 4.0–10.5)
nRBC: 0 % (ref 0.0–0.2)

## 2019-10-07 LAB — URINE CULTURE: Culture: NO GROWTH

## 2019-10-07 LAB — D-DIMER, QUANTITATIVE: D-Dimer, Quant: 5.12 ug/mL-FEU — ABNORMAL HIGH (ref 0.00–0.50)

## 2019-10-07 LAB — FERRITIN: Ferritin: 329 ng/mL (ref 24–336)

## 2019-10-07 LAB — C-REACTIVE PROTEIN: CRP: 8 mg/dL — ABNORMAL HIGH (ref <1.0)

## 2019-10-07 LAB — COMPREHENSIVE METABOLIC PANEL
ALT: 49 U/L — ABNORMAL HIGH (ref 0–44)
AST: 68 U/L — ABNORMAL HIGH (ref 15–41)
Albumin: 2.6 g/dL — ABNORMAL LOW (ref 3.5–5.0)
Alkaline Phosphatase: 53 U/L (ref 38–126)
Anion gap: 12 (ref 5–15)
BUN: 32 mg/dL — ABNORMAL HIGH (ref 8–23)
CO2: 21 mmol/L — ABNORMAL LOW (ref 22–32)
Calcium: 8.4 mg/dL — ABNORMAL LOW (ref 8.9–10.3)
Chloride: 107 mmol/L (ref 98–111)
Creatinine, Ser: 0.96 mg/dL (ref 0.61–1.24)
GFR calc Af Amer: >60 mL/min (ref >60)
GFR calc non Af Amer: >60 mL/min (ref >60)
Glucose, Bld: 142 mg/dL — ABNORMAL HIGH (ref 70–99)
Potassium: 4.3 mmol/L (ref 3.5–5.1)
Sodium: 140 mmol/L (ref 135–145)
Total Bilirubin: 0.6 mg/dL (ref 0.3–1.2)
Total Protein: 5.7 g/dL — ABNORMAL LOW (ref 6.5–8.1)

## 2019-10-07 LAB — BRAIN NATRIURETIC PEPTIDE: B Natriuretic Peptide: 110.7 pg/mL — ABNORMAL HIGH (ref 0.0–100.0)

## 2019-10-07 LAB — HEPARIN LEVEL (UNFRACTIONATED): Heparin Unfractionated: 0.53 IU/mL (ref 0.30–0.70)

## 2019-10-07 MED ORDER — SODIUM CHLORIDE 0.9 % IV BOLUS
500.0000 mL | Freq: Once | INTRAVENOUS | Status: AC
  Administered 2019-10-07: 500 mL via INTRAVENOUS

## 2019-10-07 MED ORDER — DEXTROSE-NACL 5-0.45 % IV SOLN
INTRAVENOUS | Status: DC
  Administered 2019-10-07: 15:00:00 via INTRAVENOUS

## 2019-10-07 MED ORDER — METOPROLOL TARTRATE 5 MG/5ML IV SOLN
2.5000 mg | INTRAVENOUS | Status: DC | PRN
  Administered 2019-10-08: 2.5 mg via INTRAVENOUS
  Filled 2019-10-07: qty 5

## 2019-10-07 NOTE — Progress Notes (Signed)
[name] for heparin Indication: pulmonary embolus  No Known Allergies  Patient Measurements: Height: 6' (182.9 cm) Weight: 185 lb 6.5 oz (84.1 kg) IBW/kg (Calculated) : 77.6 Heparin Dosing Weight = TBW = 80 kg (per report from ED RN)  Vital Signs: Temp: 98.4 F (36.9 C) (11/21 0642) Temp Source: Oral (11/21 0642) BP: 115/86 (11/21 0937) Pulse Rate: 102 (11/21 0642)  Labs: Recent Labs    10/08/2019 1500 10/15/2019 2018 10/06/19 0427 10/06/19 1311 10/07/19 0443  HGB  --  16.8 15.5  --  13.3  HCT  --  50.6 47.3  --  40.4  PLT  --  138* 149*  --  167  APTT 36  --   --   --   --   LABPROT 12.7  --   --   --   --   INR 1.0  --   --   --   --   HEPARINUNFRC  --   --  0.51 0.47 0.53  CREATININE  --  0.86 0.76  --  0.96    Estimated Creatinine Clearance: 64 mL/min (by C-G formula based on SCr of 0.96 mg/dL).  Assessment: Pharmacy consulted to dose/monitor heparin in this 85 year old male. Patient has PMH significant for CVA, afib, dementia. Pt was not taking anticoagulants PTA per med rec/MAR. Patient is COVID+, CT chest positive for acute bilateral PE with CT evidence of right heart strain consistent with submassive PE.   Baseline labs:  Hgb 16.2  Plt 141  D-dimer: 19.06  INR: 1  10/07/2019   heparin level therapeutic at 0.53 on rate of 1300 units/hr Hg 15.3> 13.3, PLTC 141>>167.  No bleeding reported.   Goal of Therapy:  Heparin level 0.3-0.7 units/ml Monitor platelets by anticoagulation protocol: Yes   Plan: continue heparin infusion at 1300 units/hr daily heparin level & CBC while on heparin Monitor for signs/symptoms of bleeding  Eudelia Bunch, Pharm.D [phone] 10/07/2019 9:56 AM

## 2019-10-07 NOTE — Progress Notes (Addendum)
Patient HR sustaining in the 140's-150's.  Order to start 500cc bolus prior to giving lopressor to support BP.    When giving lopressor writer noticed that lung sounds were very "wet" and pt has developed a new cough.  MD made aware.  Order for STAT chest x-ray, bolus order stopped, and continuous fluids decreased.  Oncoming RN made aware of change in status.

## 2019-10-07 NOTE — Evaluation (Signed)
Clinical/Bedside Swallow Evaluation Patient Details  Name: [name]. MRN: [id number] Date of Birth: 10-07-1934  Today's Date: 10/07/2019 Time: SLP Start Time (ACUTE ONLY): 0930 SLP Stop Time (ACUTE ONLY): 0945 SLP Time Calculation (min) (ACUTE ONLY): 15 min  Past Medical History:  Past Medical History:  Diagnosis Date  . Acute CVA (cerebrovascular accident) ([place])    subacute  . Chronic back pain   . PAF (paroxysmal atrial fibrillation) ([place])    Past Surgical History: History reviewed. No pertinent surgical history. HPI:      Assessment / Plan / Recommendation Clinical Impression  Patient presents with what appears to be a cognitive-based dysphagia with possible secondary influence from previous CVA. He had delays in oral manipulation and transit with puree solids, immediate cough with teaspoon sip thin liquids, but no s/s aspiration or penetration with teaspoon sip of nectar thick liquids. He did not form lips on cup or straw adequately to functionally assess. Currently, he is at a risk for aspiration, however he is also at a signficant risk of dehydration and malnutrition. SLP Visit Diagnosis: Dysphagia, unspecified (R13.10)    Aspiration Risk  Mild aspiration risk;Moderate aspiration risk;Risk for inadequate nutrition/hydration    Diet Recommendation Nectar-thick liquid;Dysphagia 1 (Puree)   Liquid Administration via: Spoon;Cup;No straw(spoon sips only) Medication Administration: Crushed with puree Supervision: Full supervision/cueing for compensatory strategies;Staff to assist with self feeding Compensations: Minimize environmental distractions;Slow rate;Small sips/bites Postural Changes: Seated upright at 90 degrees    Other  Recommendations Oral Care Recommendations: Oral care QID   Follow up Recommendations Other (comment)(TBD pending [place] meeting with family)      Frequency and Duration min 1 x/week  1 week       Prognosis Prognosis for Safe Diet  Advancement: Guarded Barriers to Reach Goals: Cognitive deficits;Severity of deficits      Swallow Study   General      Oral/Motor/Sensory Function Overall Oral Motor/Sensory Function: Mild impairment Facial ROM: Within Functional Limits Facial Symmetry: Within Functional Limits Facial Strength: Within Functional Limits Lingual Symmetry: Within Functional Limits Lingual Strength: Reduced   Ice Chips     Thin Liquid Thin Liquid: Impaired Presentation: Spoon Oral Phase Impairments: Reduced lingual movement/coordination;Reduced labial seal Pharyngeal  Phase Impairments: Cough - Immediate Other Comments: Immediate and prolonged coughing after one teaspoon sip of thin liquids    Nectar Thick Nectar Thick Liquid: Impaired Presentation: Spoon Oral Phase Impairments: Reduced labial seal;Reduced lingual movement/coordination Other Comments: No overt s/s of aspiration or penetration with nectar liquids via spoon sip   Honey Thick Honey Thick Liquid: Not tested   Puree Puree: Impaired Oral Phase Impairments: Reduced labial seal;Reduced lingual movement/coordination;Impaired mastication Pharyngeal Phase Impairments: Suspected delayed Swallow Other Comments: patient chewing with puree solids, but fully cleared oral cavity post swallow and good laryngeal elevation and pharyngeal contraction per palpation   Solid     Solid: Not tested      Dannial Monarch 10/07/2019,11:30 AM   Sonia Baller, MA, CCC-SLP Speech Therapy [facility]

## 2019-10-07 NOTE — Progress Notes (Addendum)
[facility]                                                                              Patient Demographics  [name], is a 85 y.o. male, DOB - Sep 24, 1934, ZOX:[id number]  Admit date - 09/24/2019   Admitting Physician Mahogani Holohan Jenna Luo, MD  Outpatient Primary MD for the patient is Patient, No Pcp Per  Outpatient specialists:   LOS - 2  days   Medical records reviewed and are as summarized below:    Chief Complaint  Patient presents with   COVID +       Brief summary   Patient is a 85 year old male with history of CVA, paroxysmal atrial fibrillation, dementia, currently resides in [facility] skilled nursing facility.  Patient was tested positive for Covid last week and then he was retested again 2 days prior to admission and remained positive.  Facility noted that patient was much more lethargic than his usual, has dementia at baseline.  He was sent to the ER for further work-up. At the time of my examination, patient opens eyes with sternal rub but does not respond to any questions.  No ongoing respiratory distress, vomiting or diarrhea.  D-dimer 19.06, fibrinogen 529, COVID-19 test positive CT angiogram of the chest showed bilateral pulmonary embolism with right heart strain, submassive PE  Assessment & Plan    Principal Problem:   1. Acute Hypoxic Resp. Failure due to Acute Covid 19 Viral Pneumonitis during the ongoing 2020 Covid 19 Pandemic - POA -O2 sats improving, hypoxia improving patient much more alert and awake however not able to voice any complaints due to advanced dementia.  O2 sats 98 to 90% on 2 L. -D-dimer 19.06 at the time of admission,  CT angiogram of the chest showed bilateral PE, placed on heparin drip -Continue IV remdesivir, dexamethasone per protocol  -CRP now trending down, hypoxia improving, continue to hold off on Actemra. - Continue to wean oxygen, ambulatory O2 screening daily as tolerated - Has + sick contact (from  skilled nursing facility) -Continue inhalers, cough suppressants, vitamins, supportive treatment - Continue to follow labs as below  Recent Labs  Lab 10/16/2019 1448 10/06/2019 1450 09/29/2019 2018 10/06/19 0427 10/07/19 0443  DDIMER 19.06*  --   --  8.95* 5.12*  FERRITIN  --  322  --  393* 329  CRP  --  7.1*  --  9.4* 8.0*  ALT 60*  --   --  61* 49*  PROCALCITON <0.10  --  <0.10  --   --      Active Problems: Acute pulmonary embolism, bilateral (HCC), acute DVT L LE -Likely due to #1, CT angiogram of the chest showed bilateral PE with right heart strain -Continue IV heparin drip, will transition to oral anticoagulation once eating consistently, SLP evaluation done today -2D echo showed EF of 55 to 60%, no WMA  Acute metabolic encephalopathy superimposed on dementia, multiinfarct, with behavioral disturbance (HCC), FTT -Patient has history of advanced dementia, CT head showed no acute infarct or bleeding -Much more alert and awake today, however does not follow commands  Aspiration, dysphagia in the setting  of advanced dementia SLP evaluation obtained, recommended dysphagia 1 diet with nectar thick liquids Placed on gentle hydration with D5 half-normal saline, not eating much.   Addendum: 6:55pm Called for inc HR 150-160's, I had ordered lopressor  IV x1 with IVF to sustain BP as BP had dropped to 80's this am. Now more dyspneic with coarse BS - will get stat CXR, BNP, stop IVF, NPO as he is likely aspirating - maintain gentle hydration at 40cc/hr as he is NPO until CXR results are available. Cont morphine for comfort and resp distress - will discuss with family again regarding GOC in am   Code Status: Currently full code DVT Prophylaxis:  Heparin gtt Family Communication: discussed with patient's son Kael Keetch yesterday on 11/20.  I was unable to get hold of both sons today, left a detailed message on Mr. Cammy Brochure phone    Disposition Plan: Pending clinical status,  currently inpatient given acute hypoxic respiratory failure, acute COVID-19 illness.     Time Spent in minutes    Procedures:  CT angiogram of the chest  Consultants:   Palliative medicine  Antimicrobials:   Anti-infectives (From admission, onward)   Start     Dose/Rate Route Frequency Ordered Stop   10/07/19 1000  remdesivir 100 mg in sodium chloride 0.9 % 250 mL IVPB     100 mg 500 mL/hr over 30 Minutes Intravenous Every 24 hours 10/06/19 0701 10/11/19 0959   10/06/19 1000  remdesivir 200 mg in sodium chloride 0.9 % 250 mL IVPB     200 mg 500 mL/hr over 30 Minutes Intravenous Once 10/06/19 0701 10/06/19 1033         Medications  Scheduled Meds:  aspirin  81 mg Oral Daily   dexamethasone (DECADRON) injection  6 mg Intravenous Q24H   Ipratropium-Albuterol  1 puff Inhalation Q6H   mouth rinse  15 mL Mouth Rinse BID   memantine  10 mg Oral BID   vitamin C  500 mg Oral Daily   zinc sulfate  220 mg Oral Daily   Continuous Infusions:  sodium chloride 1,000 mL (10/06/19 1002)   heparin 1,300 Units/hr (10/07/19 0554)   remdesivir 100 mg in NS 250 mL 100 mg (10/07/19 1004)   PRN Meds:.sodium chloride, chlorpheniramine-HYDROcodone, guaiFENesin-dextromethorphan, haloperidol lactate, metoprolol tartrate, morphine injection      Subjective:   Shishir Krantz was seen and examined today.  Much more alert and awake however still not following commands.  Hypoxia improving, no acute respiratory distress.  Does not appear to be in any pain.  No nausea or vomiting.  No coughing during my exam .  Objective:   Vitals:   10/07/19 0956 10/07/19 1008 10/07/19 1011 10/07/19 1303  BP: 105/65 (!) 89/60 99/60 (!) 152/124  Pulse: 96  98 75  Resp:      Temp:    98.4 F (36.9 C)  TempSrc:    Oral  SpO2:    98%  Weight:      Height:        Intake/Output Summary (Last 24 hours) at 10/07/2019 1427 Last data filed at 10/07/2019 1004 Gross per 24 hour  Intake 448.25  ml  Output 250 ml  Net 198.25 ml     Wt Readings from Last 3 Encounters:  09/29/2019 84.1 kg  07/13/19 80 kg  08/31/18 80.7 kg    Physical Exam  General: Alert and awake, rambling, does not follow commands or respond appropriately to questions  Eyes:  HEENT:  Atraumatic, normocephalic  Cardiovascular: S1 S2 clear, no murmurs, RRR. No pedal edema b/l  Respiratory: Scattered coarse rhonchi  Gastrointestinal: Soft, nontender, nondistended, NBS  Ext: no pedal edema bilaterally  Neuro: Moving all 4 extremities but does not follow commands  Musculoskeletal: No cyanosis, clubbing  Skin: No rashes  Psych: Confused, has dementia    Data Reviewed:  I have personally reviewed following labs and imaging studies  Micro Results Recent Results (from the past 240 hour(s))  Blood Culture (routine x 2)     Status: None (Preliminary result)   Collection Time: 10-31-19  3:30 PM   Specimen: BLOOD  Result Value Ref Range Status   Specimen Description   Final    BLOOD RIGHT ANTECUBITAL Performed at [facility], [address], [place], [place] [address]    Special Requests   Final    BOTTLES DRAWN AEROBIC AND ANAEROBIC Blood Culture adequate volume Performed at [facility], [address], [place], [place] [address]    Culture   Final    NO GROWTH 2 DAYS Performed at [facility], [address], [place], [place] [address]    Report Status PENDING  Incomplete  Urine culture     Status: None   Collection Time: 10-31-2019  7:26 PM   Specimen: Urine, Random  Result Value Ref Range Status   Specimen Description   Final    URINE, RANDOM Performed at [facility], [address], [place], [place] [address]    Special Requests   Final    NONE Performed at [facility], [address] [place], [place] [address]    Culture   Final    NO GROWTH Performed at [facility], [address] [place], [place] [address]    Report Status 10/07/2019 FINAL  Final  Blood Culture (routine x 2)     Status: None (Preliminary result)   Collection Time: 10-31-19  8:18 PM   Specimen: BLOOD LEFT ARM  Result Value Ref Range Status   Specimen Description   Final    BLOOD LEFT ARM Performed at [facility], [address], [place], [place] [address]    Special Requests   Final    BOTTLES DRAWN AEROBIC AND ANAEROBIC Blood Culture adequate volume Performed at [facility], [address] [place], [place] [address]    Culture   Final    NO GROWTH 2 DAYS Performed at [facility], [address] [place], [place] [address]    Report Status PENDING  Incomplete    Radiology Reports Ct Head Wo Contrast  Result Date: 10/31/2019 CLINICAL DATA:  Lethargy.  Altered mental status. EXAM: CT HEAD WITHOUT CONTRAST TECHNIQUE: Contiguous axial images were obtained from the base of the skull through the vertex without intravenous contrast. COMPARISON:  Brain CT 07/13/2019 FINDINGS: Brain: Ventricles and sulci are prominent compatible with atrophy. No evidence for acute cortically based infarct, intracranial hemorrhage, mass lesion or mass-effect. Focal encephalomalacia within the right periventricular white matter. 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 Chronic microvascular ischemic changes. Vascular: Unremarkable. Skull: Intact. Sinuses/Orbits: Paranasal sinuses are well aerated. Mastoid air cells are unremarkable. Other: None. IMPRESSION: No acute intracranial process. Atrophy and chronic microvascular ischemic changes. Electronically Signed   By: Annia Belt M.D.   On: 10-31-19 18:39   Ct Angio Chest Pe W And/or Wo Contrast  Result Date: October 31, 2019 CLINICAL  DATA:  Chest pain. Altered mental status. Coronavirus infection. EXAM: CT ANGIOGRAPHY CHEST WITH CONTRAST TECHNIQUE: Multidetector CT imaging of the chest was performed using  the standard protocol during bolus administration of intravenous contrast. Multiplanar CT image reconstructions and MIPs were obtained to evaluate the vascular anatomy. CONTRAST:  174mL OMNIPAQUE IOHEXOL 350 MG/ML SOLN COMPARISON:  Chest radiography same day FINDINGS: Cardiovascular: Pulmonary opacification is excellent. The patient demonstrates extensive bilateral pulmonary emboli including saddle embolus. Heart size is normal but right ventricular to left ventricular ratio is abnormal at 1.2 and therefore suggests submassive disease with right ventricular strain. There is coronary artery calcification. There is ordinary aortic atherosclerosis. Mediastinum/Nodes: No hilar or mediastinal mass or lymphadenopathy. Lungs/Pleura: Bibasilar pulmonary atelectasis. No identifiable pulmonary infiltrate. No mass lesion. Upper Abdomen: Negative Musculoskeletal: Ordinary degenerative changes affect the spine. Review of the MIP images confirms the above findings. IMPRESSION: Positive for acute bilateral PE with CT evidence of right heart strain (RV/LV Ratio = 1.2) consistent with at least submassive (intermediate risk) PE. The presence of right heart strain has been associated with an increased risk of morbidity and mortality. Please activate Code PE by paging [phone]. Coronary artery calcification. Aortic atherosclerosis. Mild basilar atelectasis. Critical Value/emergent results were called by telephone at the time of interpretation on 09/25/2019 at 6:43 pm to providerDr.Long, Who verbally acknowledged these results. Electronically Signed   By: Nelson Chimes M.D.   On: 10/10/2019 18:45   Dg Chest Port 1 View  Result Date: 10/06/2019 CLINICAL DATA:  Tachypnea. Decreased oxygen saturation. COVID-19 positive. Diagnosed with pulmonary embolus yesterday. EXAM: PORTABLE CHEST 1 VIEW COMPARISON:  Radiograph and CT yesterday. FINDINGS: Heart is normal in size with normal mediastinal contours. Minor right lung base  atelectasis. Improved left basilar atelectasis from prior radiograph no pulmonary edema, pleural effusion or pneumothorax. No acute osseous abnormalities. IMPRESSION: Improve left and unchanged right lung base atelectasis. No new abnormalities. Electronically Signed   By: Keith Rake M.D.   On: 10/06/2019 06:24   Dg Chest Port 1 View  Result Date: 10/09/2019 CLINICAL DATA:  COVID-19 positive EXAM: PORTABLE CHEST 1 VIEW COMPARISON:  Radiograph July 13, 2019 FINDINGS: Streaky basilar opacities may reflect atelectasis or early infection. No pneumothorax or effusion. Stable cardiomediastinal contours accounting for portable technique and patient rotation. No acute osseous or soft tissue abnormality. Degenerative changes are present in the imaged spine and shoulders. IMPRESSION: 1. Streaky basilar opacity, may reflect atelectasis or early infection. Electronically Signed   By: Lovena Le M.D.   On: 10/12/2019 14:35   Vas [place] Lower Extremity Venous (dvt)  Result Date: 10/06/2019  Lower Venous Study Indications: Pulmonary embolism.  Risk Factors: COVID 19 positive. Limitations: Patient positioning. Comparison Study: No prior studies. Performing Technologist: Oliver Hum RVT  Examination Guidelines: A complete evaluation includes B-mode imaging, spectral Doppler, color Doppler, and power Doppler as needed of all accessible portions of each vessel. Bilateral testing is considered an integral part of a complete examination. Limited examinations for reoccurring indications may be performed as noted.  +---------+---------------+---------+-----------+----------+--------------+  RIGHT     Compressibility Phasicity Spontaneity Properties Thrombus Aging  +---------+---------------+---------+-----------+----------+--------------+  CFV       Full            Yes       Yes                                    +---------+---------------+---------+-----------+----------+--------------+  SFJ  Full                                                              +---------+---------------+---------+-----------+----------+--------------+  FV Prox   Full                                                             +---------+---------------+---------+-----------+----------+--------------+  FV Mid    Full                                                             +---------+---------------+---------+-----------+----------+--------------+  FV Distal Full                                                             +---------+---------------+---------+-----------+----------+--------------+  PFV       Full                                                             +---------+---------------+---------+-----------+----------+--------------+  POP       Full            Yes       Yes                                    +---------+---------------+---------+-----------+----------+--------------+  PTV       Full                                                             +---------+---------------+---------+-----------+----------+--------------+  PERO      Full                                                             +---------+---------------+---------+-----------+----------+--------------+   +---------+---------------+---------+-----------+----------+-----------------+  LEFT      Compressibility Phasicity Spontaneity Properties Thrombus Aging     +---------+---------------+---------+-----------+----------+-----------------+  CFV       Full            Yes       Yes                                       +---------+---------------+---------+-----------+----------+-----------------+  SFJ       Full                                                                +---------+---------------+---------+-----------+----------+-----------------+  FV Prox   Partial         Yes       Yes                    Age Indeterminate  +---------+---------------+---------+-----------+----------+-----------------+  FV Mid    None            No        No                      Acute              +---------+---------------+---------+-----------+----------+-----------------+  FV Distal None            No        No                     Acute              +---------+---------------+---------+-----------+----------+-----------------+  PFV       Full                                                                +---------+---------------+---------+-----------+----------+-----------------+  POP       None            No        No                     Acute              +---------+---------------+---------+-----------+----------+-----------------+  PTV       Full                                                                +---------+---------------+---------+-----------+----------+-----------------+  PERO      Full                                                                +---------+---------------+---------+-----------+----------+-----------------+     Summary: Right: There is no evidence of deep vein thrombosis in the lower extremity. However, portions of this examination were limited- see technologist comments above. No cystic structure found in the popliteal fossa. Left: Findings consistent with acute deep vein thrombosis involving the left femoral vein, and left popliteal vein. No cystic structure found in the popliteal fossa.  *See table(s) above for measurements and observations. Electronically signed by Lemar LivingsBrandon Cain MD on 10/06/2019 at  5:45:34 PM.    Final     Lab Data:  CBC: Recent Labs  Lab 10/16/2019 1448 09/23/2019 2018 10/06/19 0427 10/07/19 0443  WBC 5.9 6.4 6.8 7.6  NEUTROABS 4.7  --  5.9 6.6  HGB 16.2 16.8 15.5 13.3  HCT 49.9 50.6 47.3 40.4  MCV 87.1 85.6 84.9 86.3  PLT 141* 138* 149* 167   Basic Metabolic Panel: Recent Labs  Lab 10/15/2019 1448 10/09/2019 2018 10/06/19 0427 10/07/19 0443  NA 140  --  135 140  K 4.2  --  4.2 4.3  CL 101  --  103 107  CO2 26  --  21* 21*  GLUCOSE 116*  --  166* 142*  BUN 18  --  17 32*  CREATININE 0.80 0.86 0.76  0.96  CALCIUM 8.5*  --  8.3* 8.4*   GFR: Estimated Creatinine Clearance: 64 mL/min (by C-G formula based on SCr of 0.96 mg/dL). Liver Function Tests: Recent Labs  Lab 09/19/2019 1448 10/06/19 0427 10/07/19 0443  AST 123* 110* 68*  ALT 60* 61* 49*  ALKPHOS 66 60 53  BILITOT 0.8 1.2 0.6  PROT 6.7 6.2* 5.7*  ALBUMIN 3.4* 3.0* 2.6*   No results for input(s): LIPASE, AMYLASE in the last 168 hours. No results for input(s): AMMONIA in the last 168 hours. Coagulation Profile: Recent Labs  Lab 10/16/2019 1500  INR 1.0   Cardiac Enzymes: No results for input(s): CKTOTAL, CKMB, CKMBINDEX, TROPONINI in the last 168 hours. BNP (last 3 results) No results for input(s): PROBNP in the last 8760 hours. HbA1C: No results for input(s): HGBA1C in the last 72 hours. CBG: No results for input(s): GLUCAP in the last 168 hours. Lipid Profile: Recent Labs    10/16/2019 1450  TRIG 89   Thyroid Function Tests: No results for input(s): TSH, T4TOTAL, FREET4, T3FREE, THYROIDAB in the last 72 hours. Anemia Panel: Recent Labs    10/06/19 0427 10/07/19 0443  FERRITIN 393* 329   Urine analysis:    Component Value Date/Time   COLORURINE YELLOW 10/12/2019 1926   APPEARANCEUR CLEAR 10/06/2019 1926   APPEARANCEUR Turbid 08/14/2012 1139   LABSPEC 1.025 09/22/2019 1926   LABSPEC 1.020 08/14/2012 1139   PHURINE 5.0 09/28/2019 1926   GLUCOSEU NEGATIVE 09/29/2019 1926   GLUCOSEU 50 mg/dL 88/50/2774 1287   HGBUR LARGE (A) 09/26/2019 1926   BILIRUBINUR NEGATIVE 09/28/2019 1926   BILIRUBINUR Negative 08/14/2012 1139   KETONESUR 5 (A) 10/10/2019 1926   PROTEINUR 100 (A) 10/12/2019 1926   UROBILINOGEN 0.2 05/04/2011 1135   NITRITE NEGATIVE 10/15/2019 1926   LEUKOCYTESUR NEGATIVE 09/25/2019 1926   LEUKOCYTESUR Negative 08/14/2012 1139     Siarra Gilkerson M.D. [facility] 10/07/2019, 2:27 PM   Call night coverage person covering after 7pm

## 2019-10-08 DIAGNOSIS — R4182 Altered mental status, unspecified: Secondary | ICD-10-CM | POA: Diagnosis not present

## 2019-10-08 DIAGNOSIS — I2699 Other pulmonary embolism without acute cor pulmonale: Secondary | ICD-10-CM

## 2019-10-08 DIAGNOSIS — F0151 Vascular dementia with behavioral disturbance: Secondary | ICD-10-CM | POA: Diagnosis not present

## 2019-10-08 DIAGNOSIS — R0603 Acute respiratory distress: Secondary | ICD-10-CM | POA: Diagnosis not present

## 2019-10-08 DIAGNOSIS — Z7189 Other specified counseling: Secondary | ICD-10-CM | POA: Diagnosis not present

## 2019-10-08 DIAGNOSIS — U071 COVID-19: Secondary | ICD-10-CM | POA: Diagnosis not present

## 2019-10-08 DIAGNOSIS — Z515 Encounter for palliative care: Secondary | ICD-10-CM | POA: Diagnosis not present

## 2019-10-08 DIAGNOSIS — G9341 Metabolic encephalopathy: Secondary | ICD-10-CM | POA: Diagnosis not present

## 2019-10-08 LAB — CBC WITH DIFFERENTIAL/PLATELET
Abs Immature Granulocytes: 0.04 10*3/uL (ref 0.00–0.07)
Basophils Absolute: 0 10*3/uL (ref 0.0–0.1)
Basophils Relative: 0 %
Eosinophils Absolute: 0 10*3/uL (ref 0.0–0.5)
Eosinophils Relative: 0 %
HCT: 35.7 % — ABNORMAL LOW (ref 39.0–52.0)
Hemoglobin: 11.2 g/dL — ABNORMAL LOW (ref 13.0–17.0)
Immature Granulocytes: 1 %
Lymphocytes Relative: 10 %
Lymphs Abs: 0.7 10*3/uL (ref 0.7–4.0)
MCH: 27.8 pg (ref 26.0–34.0)
MCHC: 31.4 g/dL (ref 30.0–36.0)
MCV: 88.6 fL (ref 80.0–100.0)
Monocytes Absolute: 0.4 10*3/uL (ref 0.1–1.0)
Monocytes Relative: 6 %
Neutro Abs: 5.7 10*3/uL (ref 1.7–7.7)
Neutrophils Relative %: 83 %
Platelets: 229 10*3/uL (ref 150–400)
RBC: 4.03 MIL/uL — ABNORMAL LOW (ref 4.22–5.81)
RDW: 15.4 % (ref 11.5–15.5)
WBC: 6.8 10*3/uL (ref 4.0–10.5)
nRBC: 0 % (ref 0.0–0.2)

## 2019-10-08 LAB — COMPREHENSIVE METABOLIC PANEL
ALT: 43 U/L (ref 0–44)
AST: 48 U/L — ABNORMAL HIGH (ref 15–41)
Albumin: 2.5 g/dL — ABNORMAL LOW (ref 3.5–5.0)
Alkaline Phosphatase: 46 U/L (ref 38–126)
Anion gap: 11 (ref 5–15)
BUN: 43 mg/dL — ABNORMAL HIGH (ref 8–23)
CO2: 21 mmol/L — ABNORMAL LOW (ref 22–32)
Calcium: 8.3 mg/dL — ABNORMAL LOW (ref 8.9–10.3)
Chloride: 110 mmol/L (ref 98–111)
Creatinine, Ser: 1.05 mg/dL (ref 0.61–1.24)
GFR calc Af Amer: >60 mL/min (ref >60)
GFR calc non Af Amer: >60 mL/min (ref >60)
Glucose, Bld: 153 mg/dL — ABNORMAL HIGH (ref 70–99)
Potassium: 4.1 mmol/L (ref 3.5–5.1)
Sodium: 142 mmol/L (ref 135–145)
Total Bilirubin: 0.5 mg/dL (ref 0.3–1.2)
Total Protein: 5.3 g/dL — ABNORMAL LOW (ref 6.5–8.1)

## 2019-10-08 LAB — C-REACTIVE PROTEIN: CRP: 3.6 mg/dL — ABNORMAL HIGH (ref <1.0)

## 2019-10-08 LAB — HEPARIN LEVEL (UNFRACTIONATED): Heparin Unfractionated: 0.51 IU/mL (ref 0.30–0.70)

## 2019-10-08 LAB — FERRITIN: Ferritin: 314 ng/mL (ref 24–336)

## 2019-10-08 LAB — D-DIMER, QUANTITATIVE: D-Dimer, Quant: 3.64 ug/mL-FEU — ABNORMAL HIGH (ref 0.00–0.50)

## 2019-10-08 MED ORDER — ACETAMINOPHEN 650 MG RE SUPP: 650.0000 mg | Freq: Four times a day (QID) | RECTAL | Status: DC | PRN

## 2019-10-08 MED ORDER — ONDANSETRON 4 MG PO TBDP: 4.0000 mg | ORAL_TABLET | Freq: Four times a day (QID) | ORAL | Status: DC | PRN

## 2019-10-08 MED ORDER — HALOPERIDOL 0.5 MG PO TABS
0.5000 mg | ORAL_TABLET | ORAL | Status: DC | PRN
  Filled 2019-10-08: qty 1

## 2019-10-08 MED ORDER — HEPARIN (PORCINE) 25000 UT/250ML-% IV SOLN
1300.0000 [IU]/h | INTRAVENOUS | Status: DC
  Administered 2019-10-08 – 2019-10-10 (×3): 1300 [IU]/h via INTRAVENOUS
  Filled 2019-10-08 (×3): qty 250

## 2019-10-08 MED ORDER — MORPHINE SULFATE (PF) 4 MG/ML IV SOLN
4.0000 mg | INTRAVENOUS | Status: AC
  Administered 2019-10-08: 4 mg via INTRAVENOUS

## 2019-10-08 MED ORDER — HEPARIN BOLUS VIA INFUSION
2500.0000 [IU] | Freq: Once | INTRAVENOUS | Status: AC
  Administered 2019-10-08: 2500 [IU] via INTRAVENOUS
  Filled 2019-10-08: qty 2500

## 2019-10-08 MED ORDER — BIOTENE DRY MOUTH MT LIQD: 15.0000 mL | OROMUCOSAL | Status: DC | PRN

## 2019-10-08 MED ORDER — POLYVINYL ALCOHOL 1.4 % OP SOLN
1.0000 [drp] | Freq: Four times a day (QID) | OPHTHALMIC | Status: DC | PRN
  Filled 2019-10-08: qty 15

## 2019-10-08 MED ORDER — HALOPERIDOL LACTATE 2 MG/ML PO CONC
0.5000 mg | ORAL | Status: DC | PRN
  Filled 2019-10-08: qty 0.3

## 2019-10-08 MED ORDER — GLYCOPYRROLATE 1 MG PO TABS
1.0000 mg | ORAL_TABLET | ORAL | Status: DC | PRN
  Filled 2019-10-08: qty 1

## 2019-10-08 MED ORDER — GLYCOPYRROLATE 0.2 MG/ML IJ SOLN
0.2000 mg | INTRAMUSCULAR | Status: DC | PRN
  Administered 2019-10-09 – 2019-10-10 (×5): 0.2 mg via INTRAVENOUS
  Filled 2019-10-08 (×5): qty 1

## 2019-10-08 MED ORDER — HALOPERIDOL LACTATE 5 MG/ML IJ SOLN
1.0000 mg | INTRAMUSCULAR | Status: DC | PRN
  Administered 2019-10-09 – 2019-10-10 (×6): 1 mg via INTRAVENOUS
  Filled 2019-10-08 (×6): qty 1

## 2019-10-08 MED ORDER — MORPHINE SULFATE (PF) 2 MG/ML IV SOLN
2.0000 mg | INTRAVENOUS | Status: DC | PRN
  Administered 2019-10-09: 4 mg via INTRAVENOUS
  Administered 2019-10-09 – 2019-10-10 (×2): 2 mg via INTRAVENOUS
  Administered 2019-10-10 – 2019-10-11 (×6): 4 mg via INTRAVENOUS
  Filled 2019-10-08 (×2): qty 1
  Filled 2019-10-08 (×8): qty 2

## 2019-10-08 MED ORDER — LORAZEPAM 2 MG/ML IJ SOLN
1.0000 mg | INTRAMUSCULAR | Status: DC | PRN
  Administered 2019-10-08 – 2019-10-10 (×3): 1 mg via INTRAVENOUS
  Filled 2019-10-08 (×3): qty 1

## 2019-10-08 MED ORDER — GLYCOPYRROLATE 0.2 MG/ML IJ SOLN: 0.2000 mg | INTRAMUSCULAR | Status: DC | PRN

## 2019-10-08 MED ORDER — ONDANSETRON HCL 4 MG/2ML IJ SOLN
4.0000 mg | Freq: Four times a day (QID) | INTRAMUSCULAR | Status: DC | PRN
  Administered 2019-10-10 – 2019-10-11 (×2): 4 mg via INTRAVENOUS
  Filled 2019-10-08 (×2): qty 2

## 2019-10-08 MED ORDER — ACETAMINOPHEN 325 MG PO TABS: 650.0000 mg | ORAL_TABLET | Freq: Four times a day (QID) | ORAL | Status: DC | PRN

## 2019-10-08 NOTE — Progress Notes (Addendum)
   10/08/19 0901 10/08/19 0916  MEWS Score  Resp (!) 30 (!) 32  Pulse Rate (!) 171 (!) 124  BP (!) 178/104 (!) 155/79  Temp 98.2 F (36.8 C)  --   SpO2 (!) 54 % (!) 64 %  O2 Device  --  Partial Rebreather Mask  O2 Flow Rate (L/min)  --  15 L/min  MEWS Score  MEWS RR 2 2  MEWS Pulse 3 2  MEWS Systolic 0 0  MEWS LOC 2 2  MEWS Temp 0 0  MEWS Score 7 6  MEWS Score Color Red Red  HR, RR and BP high. SpO2 low. MD Rai updated, will carry out new orders

## 2019-10-08 NOTE — Progress Notes (Addendum)
[facility]                                                                              Patient Demographics  [name], is a 85 y.o. male, DOB - July 21, 1934, MVH:[id number]  Admit date - 10/01/2019   Admitting Physician  Alejandro Luo, MD  Outpatient Primary MD for the patient is [name]  Outpatient specialists:   LOS - 3  days   Medical records reviewed and are as summarized below:    Chief Complaint  Patient presents with  . COVID +       Brief summary   Patient is a 85 year old male with history of CVA, paroxysmal atrial fibrillation, dementia, currently resides in [facility] skilled nursing facility.  Patient was tested positive for Covid last week and then he was retested again 2 days prior to admission and remained positive.  Facility noted that patient was much more lethargic than his usual, has dementia at baseline.  He was sent to the ER for further work-up. At the time of my examination, patient opens eyes with sternal rub but does not respond to any questions.  No ongoing respiratory distress, vomiting or diarrhea.  D-dimer 19.06, fibrinogen 529, COVID-19 test positive CT angiogram of the chest showed bilateral pulmonary embolism with right heart strain, submassive PE  Assessment & Plan    Principal Problem:   1. Acute Hypoxic Resp. Failure due to Acute Covid 19 Viral Pneumonitis during the ongoing 2020 Covid 19 Pandemic - POA -Continues to worsen, very hypoxic at the time of my examination this morning, O2 sats in 70s, coarse breath sounds throughout.  Patient was placed on NRB at 15 L.  Heart rate elevated 140s to 160s.  Overnight events noted, needed frequent suctioning overnight, likely aspirating -D-dimer 19.06 at the time of admission,  CT angiogram of the chest showed bilateral PE, placed on heparin drip -Patient was placed on IV remdesivir and dexamethasone Andrews protocol - Has + sick contact (from skilled nursing  facility) -Continue inhalers, cough suppressants, vitamins, supportive treatment - Continue to follow labs as below  Recent Labs  Lab 09/30/2019 1448 10/08/2019 1450 10/07/2019 2018 10/06/19 0427 10/07/19 0443 10/08/19 0309  DDIMER 19.06*  --   --  8.95* 5.12* 3.64*  FERRITIN  --  322  --  393* 329 314  CRP  --  7.1*  --  9.4* 8.0* 3.6*  ALT 60*  --   --  61* 49* 43  PROCALCITON <0.10  --  <0.10  --   --   --      Active Problems: Acute pulmonary embolism, bilateral (HCC), acute DVT L LE -Likely due to #1, CT angiogram of the chest showed bilateral PE with right heart strain -2D echo showed EF of 55 to 60%, no WMA -Patient was placed on IV heparin drip  Acute metabolic encephalopathy superimposed on dementia, multiinfarct, with behavioral disturbance (HCC), FTT -Patient has history of advanced dementia, CT head showed no acute infarct or bleeding -Unresponsive, not following any commands, in respiratory distress and hypoxia  Aspiration, dysphagia in the setting of advanced dementia SLP evaluation  was obtained on 11/21 had recommended dysphagia 1 diet with nectar thick liquids.  Goals of care -After my examination, I called patient's son, [name], updated in detail regarding worsening clinical status with hypoxia, respiratory distress, with increased work of breathing, tachycardia.  Patient had acute decline since yesterday evening and appears to be actively dying.  At this point family has requested to pursue comfort care.  Palliative medicine following, appreciate Alejandro. Onnie Andrews help, patient transitioned to comfort care, actively dying  Addendum: 6:40pm Just discussed with [name] and RN, patient's vital signs and resp status has improved. HR in 80's and sats in 90's on 4L. Unclear if this is actual improvement vs secondary to comfort measures, morphine. [name] and I decided to continue heparin drip for now, continue all other symptom control meds. Otherwise, continue  comfort care measure so son can visit tonight.  Code Status: Currently full code DVT Prophylaxis:  Heparin gtt Family Communication: discussed with patient's son [name]    Disposition Plan: Full comfort care status  actively dying, likely hospital death  Time Spent in minutes    Procedures:  CT angiogram of the chest  Consultants:   Palliative medicine  Antimicrobials:   Anti-infectives (From admission, onward)   Start     Dose/Rate Route Frequency Ordered Stop   10/07/19 1000  remdesivir 100 mg in sodium chloride 0.9 % 250 mL IVPB  Status:  Discontinued     100 mg 500 mL/hr over 30 Minutes Intravenous Every 24 hours 10/06/19 0701 10/08/19 0938   10/06/19 1000  remdesivir 200 mg in sodium chloride 0.9 % 250 mL IVPB     200 mg 500 mL/hr over 30 Minutes Intravenous Once 10/06/19 0701 10/06/19 1033         Medications  Scheduled Meds:  Continuous Infusions:  PRN Meds:.acetaminophen **OR** acetaminophen, antiseptic oral rinse, chlorpheniramine-HYDROcodone, glycopyrrolate **OR** glycopyrrolate **OR** glycopyrrolate, haloperidol **OR** haloperidol **OR** haloperidol lactate, LORazepam, morphine injection, ondansetron **OR** ondansetron (ZOFRAN) IV, polyvinyl alcohol      Subjective:   [name] was seen and examined today.  In acute respiratory distress, increased work of breathing, unresponsive.  Heart rate in 140s at the time of my examination, O2 sats 70 to 71%.  Not following any commands.  Objective:   Vitals:   10/08/19 0929 10/08/19 0945 10/08/19 0953 10/08/19 1009  BP: (!) 162/95  103/61 129/78  Pulse: (!) 127  90 (!) 124  Resp:    20  Temp:      TempSrc:      SpO2:  95% (!) 84% 93%  Weight:      Height:        Intake/Output Summary (Last 24 hours) at 10/08/2019 1036 Last data filed at 10/08/2019 0200 Gross Andrews 24 hour  Intake 1137.86 ml  Output -  Net 1137.86 ml     Wt Readings from Last 3 Encounters:  09/24/2019 84.1 kg   07/13/19 80 kg  08/31/18 80.7 kg    Physical Exam  General: Unresponsive, in acute respiratory distress, increased work of breathing  Eyes:  HEENT:  Atraumatic, normocephalic  Cardiovascular: S1 S2 clear, RRR, tachycardia  Respiratory: Coarse breath sounds throughout  Gastrointestinal: Soft, nontender, nondistended, NBS  Ext: no pedal edema bilaterally  Neuro: no new deficits  Musculoskeletal:   Skin: No rashes  Psych: Unresponsive      Data Reviewed:  I have personally reviewed following labs and imaging studies  Micro Results Recent Results (from the past 240  hour(s))  Blood Culture (routine x 2)     Status: None (Preliminary result)   Collection Time: 10/04/2019  3:30 PM   Specimen: BLOOD  Result Value Ref Range Status   Specimen Description   Final    BLOOD RIGHT ANTECUBITAL Performed at [facility] [address] [place], [place] [address]    Special Requests   Final    BOTTLES DRAWN AEROBIC AND ANAEROBIC Blood Culture adequate volume Performed at [facility] [address], [place], [place] [address]    Culture   Final    NO GROWTH 3 DAYS Performed at [facility], [place] [address], [place], [place] [address]    Report Status PENDING  Incomplete  Urine culture     Status: None   Collection Time: 10/04/2019  7:26 PM   Specimen: Urine, Random  Result Value Ref Range Status   Specimen Description   Final    URINE, RANDOM Performed at [facility] [address] [place], [place] [address]    Special Requests   Final    NONE Performed at [facility], [place] [address], [place], [place] [address]    Culture   Final    NO GROWTH Performed at [facility], [place] [address] [place], [place] [address]    Report Status 10/07/2019 FINAL  Final  Blood Culture (routine x 2)     Status: None (Preliminary result)   Collection Time: 09/23/2019  8:18 PM   Specimen: BLOOD  LEFT ARM  Result Value Ref Range Status   Specimen Description   Final    BLOOD LEFT ARM Performed at [facility], [place] [address], [place], [place] [address]    Special Requests   Final    BOTTLES DRAWN AEROBIC AND ANAEROBIC Blood Culture adequate volume Performed at [facility] [address], [place], [place] [address]    Culture   Final    NO GROWTH 3 DAYS Performed at [facility], [place] [address] [place], [place] [address]    Report Status PENDING  Incomplete    Radiology Reports Ct Head Wo Contrast  Result Date: 09/18/2019 CLINICAL DATA:  Lethargy.  Altered mental status. EXAM: CT HEAD WITHOUT CONTRAST TECHNIQUE: Contiguous axial images were obtained from the base of the skull through the vertex without intravenous contrast. COMPARISON:  Brain CT 07/13/2019 FINDINGS: Brain: Ventricles and sulci are prominent compatible with atrophy. No evidence for acute cortically based infarct, intracranial hemorrhage, mass lesion or mass-effect. Focal encephalomalacia within the right periventricular white matter. 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 Chronic microvascular ischemic changes. Vascular: Unremarkable. Skull: Intact. Sinuses/Orbits: Paranasal sinuses are well aerated. Mastoid air cells are unremarkable. Other: None. IMPRESSION: No acute intracranial process. Atrophy and chronic microvascular ischemic changes. Electronically Signed   By: Lovey Newcomer M.D.   On: 09/23/2019 18:39   Ct Angio Chest Pe W And/or Wo Contrast  Result Date: 10/15/2019 CLINICAL DATA:  Chest pain. Altered mental status. Coronavirus infection. EXAM: CT ANGIOGRAPHY CHEST WITH CONTRAST TECHNIQUE: Multidetector CT imaging of the chest was performed using the standard protocol during bolus administration of intravenous contrast. Multiplanar CT image reconstructions and MIPs were obtained to evaluate the vascular anatomy. CONTRAST:  114mL  OMNIPAQUE IOHEXOL 350 MG/ML SOLN COMPARISON:  Chest radiography same day FINDINGS: Cardiovascular: Pulmonary opacification is excellent. The patient demonstrates extensive bilateral pulmonary emboli including saddle embolus. Heart size  is normal but right ventricular to left ventricular ratio is abnormal at 1.2 and therefore suggests submassive disease with right ventricular strain. There is coronary artery calcification. There is ordinary aortic atherosclerosis. Mediastinum/Nodes: No hilar or mediastinal mass or lymphadenopathy. Lungs/Pleura: Bibasilar pulmonary atelectasis. No identifiable pulmonary infiltrate. No mass lesion. Upper Abdomen: Negative Musculoskeletal: Ordinary degenerative changes affect the spine. Review of the MIP images confirms the above findings. IMPRESSION: Positive for acute bilateral PE with CT evidence of right heart strain (RV/LV Ratio = 1.2) consistent with at least submassive (intermediate risk) PE. The presence of right heart strain has been associated with an increased risk of morbidity and mortality. Please activate Code PE by paging [phone]. Coronary artery calcification. Aortic atherosclerosis. Mild basilar atelectasis. Critical Value/emergent results were called by telephone at the time of interpretation on 2019-10-27 at 6:43 pm to providerDr.Long, Who verbally acknowledged these results. Electronically Signed   By: Paulina Fusi M.D.   On: Oct 27, 2019 18:45   Dg Chest Port 1 View  Result Date: 10/07/2019 CLINICAL DATA:  Dyspnea EXAM: PORTABLE CHEST 1 VIEW COMPARISON:  Chest radiograph from one day prior. FINDINGS: Stable cardiomediastinal silhouette with normal heart size. No pneumothorax. No pleural effusion. Minimal right basilar scarring, unchanged. No pulmonary edema. No acute consolidative airspace disease. IMPRESSION: Stable minimal right basilar scarring. Otherwise no active disease in the chest. Electronically Signed   By: Delbert Phenix M.D.   On: 10/07/2019 19:51    Dg Chest Port 1 View  Result Date: 10/06/2019 CLINICAL DATA:  Tachypnea. Decreased oxygen saturation. COVID-19 positive. Diagnosed with pulmonary embolus yesterday. EXAM: PORTABLE CHEST 1 VIEW COMPARISON:  Radiograph and CT yesterday. FINDINGS: Heart is normal in size with normal mediastinal contours. Minor right lung base atelectasis. Improved left basilar atelectasis from prior radiograph no pulmonary edema, pleural effusion or pneumothorax. No acute osseous abnormalities. IMPRESSION: Improve left and unchanged right lung base atelectasis. No new abnormalities. Electronically Signed   By: Narda Rutherford M.D.   On: 10/06/2019 06:24   Dg Chest Port 1 View  Result Date: October 27, 2019 CLINICAL DATA:  COVID-19 positive EXAM: PORTABLE CHEST 1 VIEW COMPARISON:  Radiograph July 13, 2019 FINDINGS: Streaky basilar opacities may reflect atelectasis or early infection. No pneumothorax or effusion. Stable cardiomediastinal contours accounting for portable technique and patient rotation. No acute osseous or soft tissue abnormality. Degenerative changes are present in the imaged spine and shoulders. IMPRESSION: 1. Streaky basilar opacity, may reflect atelectasis or early infection. Electronically Signed   By: Kreg Shropshire M.D.   On: Oct 27, 2019 14:35   Vas [place] Lower Extremity Venous (dvt)  Result Date: 10/06/2019  Lower Venous Study Indications: Pulmonary embolism.  Risk Factors: COVID 19 positive. Limitations: Patient positioning. Comparison Study: No prior studies. Performing Technologist: Chanda Busing RVT  Examination Guidelines: A complete evaluation includes B-mode imaging, spectral Doppler, color Doppler, and power Doppler as needed of all accessible portions of each vessel. Bilateral testing is considered an integral part of a complete examination. Limited examinations for reoccurring indications may be performed as noted.  +---------+---------------+---------+-----------+----------+--------------+  RIGHT    CompressibilityPhasicitySpontaneityPropertiesThrombus Aging +---------+---------------+---------+-----------+----------+--------------+ CFV      Full           Yes      Yes                                 +---------+---------------+---------+-----------+----------+--------------+ SFJ      Full                                                        +---------+---------------+---------+-----------+----------+--------------+  FV Prox  Full                                                        +---------+---------------+---------+-----------+----------+--------------+ FV Mid   Full                                                        +---------+---------------+---------+-----------+----------+--------------+ FV DistalFull                                                        +---------+---------------+---------+-----------+----------+--------------+ PFV      Full                                                        +---------+---------------+---------+-----------+----------+--------------+ POP      Full           Yes      Yes                                 +---------+---------------+---------+-----------+----------+--------------+ PTV      Full                                                        +---------+---------------+---------+-----------+----------+--------------+ PERO     Full                                                        +---------+---------------+---------+-----------+----------+--------------+   +---------+---------------+---------+-----------+----------+-----------------+ LEFT     CompressibilityPhasicitySpontaneityPropertiesThrombus Aging    +---------+---------------+---------+-----------+----------+-----------------+ CFV      Full           Yes      Yes                                    +---------+---------------+---------+-----------+----------+-----------------+ SFJ      Full                                                            +---------+---------------+---------+-----------+----------+-----------------+ FV Prox  Partial        Yes      Yes  Age Indeterminate +---------+---------------+---------+-----------+----------+-----------------+ FV Mid   None           No       No                   Acute             +---------+---------------+---------+-----------+----------+-----------------+ FV DistalNone           No       No                   Acute             +---------+---------------+---------+-----------+----------+-----------------+ PFV      Full                                                           +---------+---------------+---------+-----------+----------+-----------------+ POP      None           No       No                   Acute             +---------+---------------+---------+-----------+----------+-----------------+ PTV      Full                                                           +---------+---------------+---------+-----------+----------+-----------------+ PERO     Full                                                           +---------+---------------+---------+-----------+----------+-----------------+     Summary: Right: There is no evidence of deep vein thrombosis in the lower extremity. However, portions of this examination were limited- see technologist comments above. No cystic structure found in the popliteal fossa. Left: Findings consistent with acute deep vein thrombosis involving the left femoral vein, and left popliteal vein. No cystic structure found in the popliteal fossa.  *See table(s) above for measurements and observations. Electronically signed by Lemar Livings MD on 10/06/2019 at 5:45:34 PM.    Final     Lab Data:  CBC: Recent Labs  Lab 10/04/2019 1448 10/09/2019 2018 10/06/19 0427 10/07/19 0443 10/08/19 0309  WBC 5.9 6.4 6.8 7.6 6.8  NEUTROABS 4.7  --  5.9 6.6 5.7  HGB 16.2 16.8  15.5 13.3 11.2*  HCT 49.9 50.6 47.3 40.4 35.7*  MCV 87.1 85.6 84.9 86.3 88.6  PLT 141* 138* 149* 167 229   Basic Metabolic Panel: Recent Labs  Lab 09/27/2019 1448 09/19/2019 2018 10/06/19 0427 10/07/19 0443 10/08/19 0309  NA 140  --  135 140 142  K 4.2  --  4.2 4.3 4.1  CL 101  --  103 107 110  CO2 26  --  21* 21* 21*  GLUCOSE 116*  --  166* 142* 153*  BUN 18  --  17 32* 43*  CREATININE 0.80 0.86 0.76 0.96 1.05  CALCIUM 8.5*  --  8.3* 8.4* 8.3*   GFR: Estimated Creatinine Clearance: 58.5 mL/min (by C-G formula based on SCr of 1.05 mg/dL). Liver Function Tests: Recent Labs  Lab Oct 10, 2019 1448 10/06/19 0427 10/07/19 0443 10/08/19 0309  AST 123* 110* 68* 48*  ALT 60* 61* 49* 43  ALKPHOS 66 60 53 46  BILITOT 0.8 1.2 0.6 0.5  PROT 6.7 6.2* 5.7* 5.3*  ALBUMIN 3.4* 3.0* 2.6* 2.5*   No results for input(s): LIPASE, AMYLASE in the last 168 hours. No results for input(s): AMMONIA in the last 168 hours. Coagulation Profile: Recent Labs  Lab 10/10/2019 1500  INR 1.0   Cardiac Enzymes: No results for input(s): CKTOTAL, CKMB, CKMBINDEX, TROPONINI in the last 168 hours. BNP (last 3 results) No results for input(s): PROBNP in the last 8760 hours. HbA1C: No results for input(s): HGBA1C in the last 72 hours. CBG: No results for input(s): GLUCAP in the last 168 hours. Lipid Profile: Recent Labs    Oct 10, 2019 1450  TRIG 89   Thyroid Function Tests: No results for input(s): TSH, T4TOTAL, FREET4, T3FREE, THYROIDAB in the last 72 hours. Anemia Panel: Recent Labs    10/07/19 0443 10/08/19 0309  FERRITIN 329 314   Urine analysis:    Component Value Date/Time   COLORURINE YELLOW 10/10/19 1926   APPEARANCEUR CLEAR October 10, 2019 1926   APPEARANCEUR Turbid 08/14/2012 1139   LABSPEC 1.025 2019/10/10 1926   LABSPEC 1.020 08/14/2012 1139   PHURINE 5.0 10-10-19 1926   GLUCOSEU NEGATIVE 2019-10-10 1926   GLUCOSEU 50 mg/dL 40/98/1191 4782   HGBUR LARGE (A) 10-Oct-2019 1926    BILIRUBINUR NEGATIVE 10-10-19 1926   BILIRUBINUR Negative 08/14/2012 1139   KETONESUR 5 (A) 10/10/2019 1926   PROTEINUR 100 (A) October 10, 2019 1926   UROBILINOGEN 0.2 05/04/2011 1135   NITRITE NEGATIVE 10-10-19 1926   LEUKOCYTESUR NEGATIVE Oct 10, 2019 1926   LEUKOCYTESUR Negative 08/14/2012 1139       M.D. [facility] 10/08/2019, 10:36 AM   Call night coverage person covering after 7pm

## 2019-10-08 NOTE — Progress Notes (Signed)
[place] for heparin Indication: pulmonary embolus  No Known Allergies  Patient Measurements: Height: 6' (182.9 cm) Weight: 185 lb 6.5 oz (84.1 kg) IBW/kg (Calculated) : 77.6 Heparin Dosing Weight = TBW = 80 kg (per report from ED RN)  Vital Signs: Temp: 97.8 F (36.6 C) (11/22 1401) Temp Source: Oral (11/22 1401) BP: 102/50 (11/22 1401) Pulse Rate: 104 (11/22 1401)  Labs: Recent Labs    10/06/19 0427 10/06/19 1311 10/07/19 0443 10/08/19 0309  HGB 15.5  --  13.3 11.2*  HCT 47.3  --  40.4 35.7*  PLT 149*  --  167 229  HEPARINUNFRC 0.51 0.47 0.53 0.51  CREATININE 0.76  --  0.96 1.05    Estimated Creatinine Clearance: 58.5 mL/min (by C-G formula based on SCr of 1.05 mg/dL).  Assessment: Pharmacy consulted to dose/monitor heparin in this 85 year old male. Patient has PMH significant for CVA, afib, dementia. Pt was not taking anticoagulants PTA per med rec/MAR. Patient is COVID+, CT chest positive for acute bilateral PE with CT evidence of right heart strain consistent with submassive PE.   Baseline labs:  Hgb 16.2  Plt 141  D-dimer: 19.06  INR: 1  10/08/2019  AM heparin level therapeutic at 0.51 on rate of 1300 units/hr Hg 15.3> 13.3> 11.2, PLTC 141>>167>229.  No bleeding reported.  Heparin drip was stopped this morning at 10 am per MD order.  Now heparin is to be resumed for saddle PE.   Goal of Therapy:  Heparin level 0.3-0.7 units/ml Monitor platelets by anticoagulation protocol: Yes   Plan: Heparin 2500 unit IV bolus and  heparin infusion at 1300 units/hr and check 8 hr heparin level daily heparin level & CBC while on heparin Monitor for signs/symptoms of bleeding F/u transition to oral anticoagualation  Eudelia Bunch, Pharm.D [phone] 10/08/2019 6:44 PM

## 2019-10-08 NOTE — Progress Notes (Signed)
Palliative care progress note  Reason for consult: GOC in light of COVID and massive PE  Received call from Dr. Tana Coast that [name] condition is worsening.  I also spoke with his bedside care team.  He has received 1 mg of morphine and is still showing signs of acute distress with increased work of breathing.  I called and spoke with patients sons, [name] and [name]).    We discussed his clinical course overnight and the acute decline he has suffered.  I shared with him that [name] is unfortunately dying regardless of interventions moving forward.  We reviewed options for his care including continuation of current interventions versus refocusing care only on things for his comfort.  His sons have been clear all along with that if he is going to die, they want comfort to be his focus.  With his acute change overnight, this has become apparent and his sons are at peace with plan to transition to full comfort care.  - DNR/DNI -Transition to full comfort care.  Patient received 1 mg of morphine and still with increased work of breathing. -[name] is actively dying and I think that he will likely die in the next few hours.  Will therefore plan to utilize IV pushes for management of SOB.  We will start with morphine 4 mg x 1 dose, which will be roughly equivalent to steady-state of 1 mg/h of morphine infusion.  We will continue with additional morphine 2 to 4 mg every hour as needed.  Would have a low threshold to start continuous infusion of morphine if needed later today based on his PRN needs and prognosis. - Additional orders for comfort placed using EOL order set. - Once he receives additional medication for comfort, recommend working to titrate down oxygen.  He is currently wearing 15 L nasal cannula as well as nonrebreather.  This would not be adding to his comfort at this point and will cause him to feel worse through excessive drying. - Discussed end of life visitation exemptions.   At this point, family not planning to visit as patient is no longer responsive and any visitation is concern for spreading COVID infection.   Total time: 40 minutes  Greater than 50%  of this time was spent counseling and coordinating care related to the above assessment and plan.  Micheline Rough, MD [facility] Team [phone]

## 2019-10-08 NOTE — Progress Notes (Signed)
Palliative care progress note  Reason for consult: GOC in light of COVID and massive PE  I called and spoke with patients sons.  Reviewed his clinical course and discussed again how critically ill he is at this point.  They report understanding his condition and likelihood that he may decompensate.  - DNR/DNI - Continue current interventions and reassess situation in 24 hours.  His sons are clear that they do not want him to suffer and understand that he is critically ill.  - Placed orders for medications as needed for symptom management.  Sons would like to be updated if his situation changes, however, medications for his symptomatic relief should not be withheld if needed for his comfort.  Total time: 20 minutes  Greater than 50%  of this time was spent counseling and coordinating care related to the above assessment and plan.  Micheline Rough, MD [facility] [phone]

## 2019-10-08 NOTE — Progress Notes (Signed)
 for heparin Indication: pulmonary embolus  No Known Allergies  Patient Measurements: Height: 6' (182.9 cm) Weight: 185 lb 6.5 oz (84.1 kg) IBW/kg (Calculated) : 77.6 Heparin Dosing Weight = TBW = 80 kg (per report from ED RN)  Vital Signs: Temp: 98.4 F (36.9 C) (11/22 0530) Temp Source: Oral (11/22 0530) BP: 94/71 (11/22 0530) Pulse Rate: 98 (11/22 0530)  Labs: Recent Labs    10/04/2019 1500  10/06/19 0427 10/06/19 1311 10/07/19 0443 10/08/19 0309  HGB  --    < > 15.5  --  13.3 11.2*  HCT  --    < > 47.3  --  40.4 35.7*  PLT  --    < > 149*  --  167 229  APTT 36  --   --   --   --   --   LABPROT 12.7  --   --   --   --   --   INR 1.0  --   --   --   --   --   HEPARINUNFRC  --    < > 0.51 0.47 0.53 0.51  CREATININE  --    < > 0.76  --  0.96 1.05   < > = values in this interval not displayed.    Estimated Creatinine Clearance: 58.5 mL/min (by C-G formula based on SCr of 1.05 mg/dL).  Assessment: Pharmacy consulted to dose/monitor heparin in this 85 year old male. Patient has PMH significant for CVA, afib, dementia. Pt was not taking anticoagulants PTA per med rec/MAR. Patient is COVID+, CT chest positive for acute bilateral PE with CT evidence of right heart strain consistent with submassive PE.   Baseline labs:  Hgb 16.2  Plt 141  D-dimer: 19.06  INR: 1  10/08/2019   heparin level therapeutic at 0.51 on rate of 1300 units/hr Hg 15.3> 13.3> 11.2, PLTC 141>>167>229.  No bleeding reported.   Goal of Therapy:  Heparin level 0.3-0.7 units/ml Monitor platelets by anticoagulation protocol: Yes   Plan: continue heparin infusion at 1300 units/hr daily heparin level & CBC while on heparin Monitor for signs/symptoms of bleeding F/u transition to oral anticoagualation  Eudelia Bunch, Pharm.D [phone] 10/08/2019 8:34 AM

## 2019-10-08 NOTE — Progress Notes (Signed)
Called to room to assist RN patient HR 170's And Spo2 60 on NRB @ 15 Liters.  Patient given Lopressor 2.5 mg HR decreased to 105 after medication. . Dr. Tana Coast on unit aware of situation new orders received.

## 2019-10-09 DIAGNOSIS — R0603 Acute respiratory distress: Secondary | ICD-10-CM | POA: Diagnosis not present

## 2019-10-09 DIAGNOSIS — U071 COVID-19: Secondary | ICD-10-CM | POA: Diagnosis not present

## 2019-10-09 DIAGNOSIS — Z515 Encounter for palliative care: Secondary | ICD-10-CM | POA: Diagnosis not present

## 2019-10-09 DIAGNOSIS — J9600 Acute respiratory failure, unspecified whether with hypoxia or hypercapnia: Secondary | ICD-10-CM | POA: Diagnosis not present

## 2019-10-09 DIAGNOSIS — I2699 Other pulmonary embolism without acute cor pulmonale: Secondary | ICD-10-CM | POA: Diagnosis not present

## 2019-10-09 LAB — HEPARIN LEVEL (UNFRACTIONATED): Heparin Unfractionated: 0.51 [IU]/mL (ref 0.30–0.70)

## 2019-10-09 LAB — CBC
HCT: 38 % — ABNORMAL LOW (ref 39.0–52.0)
Hemoglobin: 12.3 g/dL — ABNORMAL LOW (ref 13.0–17.0)
MCH: 28 pg (ref 26.0–34.0)
MCHC: 32.4 g/dL (ref 30.0–36.0)
MCV: 86.6 fL (ref 80.0–100.0)
Platelets: 227 10*3/uL (ref 150–400)
RBC: 4.39 MIL/uL (ref 4.22–5.81)
RDW: 15.6 % — ABNORMAL HIGH (ref 11.5–15.5)
WBC: 9.8 10*3/uL (ref 4.0–10.5)
nRBC: 0 % (ref 0.0–0.2)

## 2019-10-09 MED ORDER — GLYCOPYRROLATE 0.2 MG/ML IJ SOLN
0.2000 mg | Freq: Three times a day (TID) | INTRAMUSCULAR | Status: DC
  Administered 2019-10-09 – 2019-10-13 (×13): 0.2 mg via INTRAVENOUS
  Filled 2019-10-09 (×13): qty 1

## 2019-10-09 NOTE — Progress Notes (Signed)
ANTICOAGULATION CONSULT NOTE   Pharmacy Consult for heparin Indication: PE/DVT  No Known Allergies  Patient Measurements: Height: 6' (182.9 cm) Weight: 185 lb 6.5 oz (84.1 kg) IBW/kg (Calculated) : 77.6 Heparin Dosing Weight = total body weight     Labs: Recent Labs    10/07/19 0443 10/08/19 0309 10/09/19 0412  HGB 13.3 11.2* 12.3*  HCT 40.4 35.7* 38.0*  PLT 167 229 227  HEPARINUNFRC 0.53 0.51 0.51  CREATININE 0.96 1.05  --     Estimated Creatinine Clearance: 58.5 mL/min (by C-G formula based on SCr of 1.05 mg/dL).  Assessment: Pharmacy consulted to dose/monitor heparin in this 85 year old male. Patient has PMH significant for CVA, afib, dementia. Pt was not taking anticoagulants PTA per med rec/MAR. Patient is COVID+, CT chest positive for acute bilateral PE with CT evidence of right heart strain consistent with submassive PE. Lowe extremity doppler + for  LLE DVT.   Baseline labs:  Hgb 16.2  Plt 141  D-dimer: 19.06  INR: 1  Today, 10/09/19:  Heparin level = 0.51 units/mL, therapeutic  Hgb 12.3, Pltc WNL  No bleeding or infusion issues reported per nursing.    Goal of Therapy:  Heparin level 0.3-0.7 units/ml Monitor platelets by anticoagulation protocol: Yes   Plan: Continue heparin infusion at 1300 units/hr Daily heparin level & CBC while on heparin Monitor for signs/symptoms of bleeding   Lindell Spar, PharmD, BCPS Clinical Pharmacist  10/09/2019 5:56 AM

## 2019-10-09 NOTE — Progress Notes (Signed)
Palliative care progress note  Reason for consult: GOC in light of COVID and massive PE  [name] remains largely nonresponsive but has been maintaining his oxygen saturation.  Discussed with Dr. Roger Shelter and agree that he is still appropriate for visitation exemption as he is dying.  I called and spoke with patients son, [name]).    We discussed his clinical course overnight.  His sons have been clear all along with that if he is going to die, they want comfort to be his focus.  Will continue with heparin infusion for PE as well as aggressive management of his symptoms.    - DNR/DNI - Goal remains comfort first and foremost.  He has been restarted on heparin infusion for massive PE. - Discussed end of life visitation exemptions.  His son, Alejandro Phenix reports that someone in the family may want to come for his allotted visit today.  Discussed with Dr. Roger Shelter and we agree this is appropriate as he is dying.  Son know to communicate to charge RN who will be coming for visitation.   Total time: 20 minutes  Greater than 50%  of this time was spent counseling and coordinating care related to the above assessment and plan.  Micheline Rough, MD [facility] Team [phone]

## 2019-10-09 NOTE — Progress Notes (Signed)
Patient suctioned due to excessive mucos. Tolerated procedure. SpO2 after suctioning was 95% on 15L NRB.

## 2019-10-09 NOTE — Care Management Important Message (Signed)
Important Message  Patient Details IM Letter given to [name] RN to present to the Patient Name: [name]. MRN: [id number] Date of Birth: Dec 16, 1933   Medicare Important Message Given:  Yes     Kerin Salen 10/09/2019, 11:25 AM

## 2019-10-09 NOTE — Progress Notes (Signed)
[facility]                                                                              Patient Demographics  [name], is a 85 y.o. male, DOB - 10-23-1934, ZOX:[id number]  Admit date - 10/31/19   Admitting Physician Ripudeep Jenna Luo, MD  Outpatient Primary MD for the patient is Patient, No Pcp Per  Outpatient specialists:   LOS - 4  days   Medical records reviewed and are as summarized below:    Chief Complaint  Patient presents with  . COVID +       Brief summary   Patient is a 85 year old male with history of CVA, paroxysmal atrial fibrillation, dementia, currently resides in [facility] skilled nursing facility.  Patient was tested positive for Covid last week and then he was retested again 2 days prior to admission and remained positive.  Facility noted that patient was much more lethargic than his usual, has dementia at baseline.  He was sent to the ER for further work-up. At the time of my examination, patient opens eyes with sternal rub but does not respond to any questions.  No ongoing respiratory distress, vomiting or diarrhea.  D-dimer 19.06, fibrinogen 529, COVID-19 test positive CT angiogram of the chest showed bilateral pulmonary embolism with right heart strain, submassive PE    Subjective:   [name] was seen and examined this morning, under comfort care, palliative following. For some on reason likely due to pain medication and comfort care patient is calm, vital signs stabilized, saturation has improved, heart rate has improved. Remained hemodynamically stable this morning. But remained unconscious, was not able to participate in exam or communicate.  -To my surprise patient was satting 85% on 4 L of oxygen yesterday this morning at rest he is satting 94% on room air.   Assessment & Plan    Principal Problem:   1. Acute Hypoxic Resp. Failure due to Acute Covid 19 Viral Pneumonitis during the ongoing 2020 Covid 19  Pandemic - POA -Unexpectedly under comfort care possibly due to effect of morphine patient remained calm comfortable but unconscious, which improved his O2 sat to 94 - 96 6%.  -Still labored breathing was noted but comfortable, currently heart rate is 104, O2 sat 96% on room air. Appreciate palliative care assisting in care of patient.   On arrival coarse breath sounds throughout.  Patient was placed on NRB at 15 L.  Heart rate elevated 140s to 160s.   -Patient continues to need suctioning for excessive secretion, likely lead to aspiration  -D-dimer 19.06 at the time of admission,   CT angiogram of the chest showed bilateral PE, placed on heparin drip -Patient was placed on IV remdesivir and dexamethasone per protocol - Has + sick contact (from skilled nursing facility) -Continue inhalers, cough suppressants, vitamins, supportive treatment - Continue to follow labs as below  Recent Labs  Lab 10/31/19 1448 31-Oct-2019 1450 October 31, 2019 2018 10/06/19 0427 10/07/19 0443 10/08/19 0309  DDIMER 19.06*  --   --  8.95* 5.12* 3.64*  FERRITIN  --  322  --  393* 329 314  CRP  --  7.1*  --  9.4* 8.0* 3.6*  ALT 60*  --   --  61* 49* 43  PROCALCITON <0.10  --  <0.10  --   --   --      Active Problems:   Acute pulmonary embolism, bilateral (HCC), acute DVT L LE -Hypoxia improved, was briefly taken off heparin as patient is status improved, for palliative reason IV heparin was restarted for now.  -Likely due to #1, CT angiogram of the chest showed bilateral PE with right heart strain -2D echo showed EF of 55 to 60%, no WMA - on IV heparin drip  Acute metabolic encephalopathy superimposed on dementia, multiinfarct, with behavioral disturbance (HCC), FTT -patient remains encephalopathic -with minimal response of this this a.m. -Patient has history of advanced dementia, CT head showed no acute infarct or bleeding -Unresponsive, not following any commands, in respiratory distress and hypoxia   Aspiration, dysphagia in the setting of advanced dementia SLP evaluation was obtained on 11/21 had recommended dysphagia 1 diet with nectar thick liquids.  Goals of care -patient's son, [name], [name] Dr. Onnie Boer help, patient transitioned to comfort care, actively dying -After initiating palliative care-morphine patient status slightly improved, O2 sats heart rate have stabilized-it was decided to restart the heparin drip for now -We will continue to update family with patient status as he remains encephalopathic unresponsive  Addendum: 6:40pm Dr Neale Burly and I decided to continue heparin drip for now, continue all other symptom control meds. Otherwise, continue comfort care measure so son can visit tonight.  Code Status: DNR/DNI DVT Prophylaxis:  Heparin gtt Family Communication: discussed with patient's son [name]    Disposition Plan: Full comfort care status  actively dying, likely hospital death  Time Spent in minutes    Procedures:  CT angiogram of the chest  Consultants:   Palliative medicine  Antimicrobials:   Anti-infectives (From admission, onward)   Start     Dose/Rate Route Frequency Ordered Stop   10/07/19 1000  remdesivir 100 mg in sodium chloride 0.9 % 250 mL IVPB  Status:  Discontinued     100 mg 500 mL/hr over 30 Minutes Intravenous Every 24 hours 10/06/19 0701 10/08/19 0938   10/06/19 1000  remdesivir 200 mg in sodium chloride 0.9 % 250 mL IVPB     200 mg 500 mL/hr over 30 Minutes Intravenous Once 10/06/19 0701 10/06/19 1033         Medications  Scheduled Meds: . glycopyrrolate  0.2 mg Intravenous Q8H   Continuous Infusions: . heparin 1,300 Units/hr (10/08/19 1900)   PRN Meds:.acetaminophen **OR** acetaminophen, antiseptic oral rinse, chlorpheniramine-HYDROcodone, glycopyrrolate **OR** glycopyrrolate **OR** glycopyrrolate, haloperidol **OR** haloperidol **OR** haloperidol lactate, LORazepam, morphine injection, ondansetron  **OR** ondansetron (ZOFRAN) IV, polyvinyl alcohol      Objective:   Vitals:   10/08/19 1009 10/08/19 1401 10/09/19 0600 10/09/19 0809  BP: 129/78 (!) 102/50 99/79   Pulse: (!) 124 (!) 104 95   Resp: 20 (!) 24 (!) 28   Temp:  97.8 F (36.6 C) 98.5 F (36.9 C)   TempSrc:  Oral Oral   SpO2: 93% 96% (!) 85% 94%  Weight:      Height:        Intake/Output Summary (Last 24 hours) at 10/09/2019 1150 Last data filed at 10/09/2019 0900 Gross per 24 hour  Intake 154.49 ml  Output 400 ml  Net -245.51 ml     Wt Readings from Last 3 Encounters:  10/09/2019 84.1 kg  07/13/19 80 kg  08/31/18 80.7 kg  BP 99/79 (BP Location: Left Arm)   Pulse 95   Temp 98.5 F (36.9 C) (Oral)   Resp (!) 28   Ht 6' (1.829 m)   Wt 84.1 kg   SpO2 94%   BMI 25.15 kg/m    Physical Exam  Constitution: Unresponsive, withdraws to pain stimuli, resting comfortably in bed Psychiatric: Unresponsive HEENT: Limited exam eyes are closed,  Chest:Chest symmetric Cardio vascular:  S1/S2, RRR, No murmure, No Rubs or Gallops  pulmonary: Clear to auscultation bilaterally, respirations unlabored, negative wheezes / crackles Abdomen: Soft, non-tender, non-distended, bowel sounds,no masses, no organomegaly Muscular skeletal: Limited exam - in bed Neuro: Limited patient is resting comfortably but unresponsive Extremities: No pitting edema lower extremities, +2 pulses  Skin: Dry, warm to touch, negative for any Rashes, No open wounds Wounds: per nursing documentation  Data Reviewed:  I have personally reviewed following labs and imaging studies  Micro Results Recent Results (from the past 240 hour(s))  Blood Culture (routine x 2)     Status: None (Preliminary result)   Collection Time: 09/19/2019  3:30 PM   Specimen: BLOOD  Result Value Ref Range Status   Specimen Description   Final    BLOOD RIGHT ANTECUBITAL Performed at [facility], [address], [place], [place] [address]    Special  Requests   Final    BOTTLES DRAWN AEROBIC AND ANAEROBIC Blood Culture adequate volume Performed at [facility], [address], [place], [place] [address]    Culture   Final    NO GROWTH 4 DAYS Performed at [facility], [address] [place], [place] [address]    Report Status PENDING  Incomplete  Urine culture     Status: None   Collection Time: 10/12/2019  7:26 PM   Specimen: Urine, Random  Result Value Ref Range Status   Specimen Description   Final    URINE, RANDOM Performed at [facility], [address], [place], [place] [address]    Special Requests   Final    NONE Performed at [facility], [address], [place], [place] [address]    Culture   Final    NO GROWTH Performed at [facility], [address], [place], [place] [address]    Report Status 10/07/2019 FINAL  Final  Blood Culture (routine x 2)     Status: None (Preliminary result)   Collection Time: 09/17/2019  8:18 PM   Specimen: BLOOD LEFT ARM  Result Value Ref Range Status   Specimen Description   Final    BLOOD LEFT ARM Performed at [facility], [address], [place], [place] [address]    Special Requests   Final    BOTTLES DRAWN AEROBIC AND ANAEROBIC Blood Culture adequate volume Performed at [facility], [address], [place], [place] [address]    Culture   Final    NO GROWTH 4 DAYS Performed at [facility], [address] [place], [place] [address]    Report Status PENDING  Incomplete    Radiology Reports Ct Head Wo Contrast  Result Date: 09/28/2019 CLINICAL DATA:  Lethargy.  Altered mental status. EXAM: CT HEAD WITHOUT CONTRAST TECHNIQUE: Contiguous axial images were obtained from the base of the skull through the vertex without intravenous contrast. COMPARISON:  Brain CT 07/13/2019 FINDINGS: Brain: Ventricles and sulci are prominent compatible with atrophy. No evidence for acute  cortically based infarct, intracranial hemorrhage, mass lesion or mass-effect. Focal encephalomalacia within the right periventricular white matter. 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 Chronic microvascular ischemic changes. Vascular: Unremarkable. Skull: Intact. Sinuses/Orbits: Paranasal sinuses are well aerated. Mastoid air cells are unremarkable. Other: None. IMPRESSION: No acute intracranial process. Atrophy and chronic microvascular ischemic changes. Electronically Signed   By: Annia Beltrew  Davis M.D.   On: 10/09/2019 18:39   Ct Angio Chest Pe W And/or Wo Contrast  Result Date: 09/18/2019 CLINICAL DATA:  Chest pain. Altered mental status. Coronavirus infection. EXAM: CT ANGIOGRAPHY CHEST WITH CONTRAST TECHNIQUE: Multidetector CT imaging of the chest was performed using the standard protocol during bolus administration of intravenous contrast. Multiplanar CT image reconstructions and MIPs were obtained to evaluate the vascular anatomy. CONTRAST:  100mL OMNIPAQUE IOHEXOL 350 MG/ML SOLN COMPARISON:  Chest radiography same day FINDINGS: Cardiovascular: Pulmonary opacification is excellent. The patient demonstrates extensive bilateral pulmonary emboli including saddle embolus. Heart size is normal but right ventricular to left ventricular ratio is abnormal at 1.2 and therefore suggests submassive disease with right ventricular strain. There is coronary artery calcification. There is ordinary aortic atherosclerosis. Mediastinum/Nodes: No hilar or mediastinal mass or lymphadenopathy. Lungs/Pleura: Bibasilar pulmonary atelectasis. No identifiable pulmonary infiltrate. No mass lesion. Upper Abdomen: Negative Musculoskeletal: Ordinary degenerative changes affect the spine. Review of the MIP images confirms the above findings. IMPRESSION: Positive for acute bilateral PE with CT evidence of right heart strain (RV/LV Ratio = 1.2) consistent with at least submassive (intermediate  risk) PE. The presence of right heart strain has been associated with an increased risk of morbidity and mortality. Please activate Code PE by paging [phone]. Coronary artery calcification. Aortic atherosclerosis. Mild basilar atelectasis. Critical Value/emergent results were called by telephone at the time of interpretation on 10/09/2019 at 6:43 pm to providerDr.Long, Who verbally acknowledged these results. Electronically Signed   By: Paulina FusiMark  Shogry M.D.   On: 10/16/2019 18:45   Dg Chest Port 1 View  Result Date: 10/07/2019 CLINICAL DATA:  Dyspnea EXAM: PORTABLE CHEST 1 VIEW COMPARISON:  Chest radiograph from one day prior. FINDINGS: Stable cardiomediastinal silhouette with normal heart size. No pneumothorax. No pleural effusion. Minimal right basilar scarring, unchanged. No pulmonary edema. No acute consolidative airspace disease. IMPRESSION: Stable minimal right basilar scarring. Otherwise no active disease in the chest. Electronically Signed   By: Delbert PhenixJason A Poff M.D.   On: 10/07/2019 19:51   Dg Chest Port 1 View  Result Date: 10/06/2019 CLINICAL DATA:  Tachypnea. Decreased oxygen saturation. COVID-19 positive. Diagnosed with pulmonary embolus yesterday. EXAM: PORTABLE CHEST 1 VIEW COMPARISON:  Radiograph and CT yesterday. FINDINGS: Heart is normal in size with normal mediastinal contours. Minor right lung base atelectasis. Improved left basilar atelectasis from prior radiograph no pulmonary edema, pleural effusion or pneumothorax. No acute osseous abnormalities. IMPRESSION: Improve left and unchanged right lung base atelectasis. No new abnormalities. Electronically Signed   By: Narda RutherfordMelanie  Sanford M.D.   On: 10/06/2019 06:24   Dg Chest Port 1 View  Result Date: 09/30/2019 CLINICAL DATA:  COVID-19 positive EXAM: PORTABLE CHEST 1 VIEW COMPARISON:  Radiograph July 13, 2019 FINDINGS: Streaky basilar opacities may reflect atelectasis or early infection. No pneumothorax or effusion. Stable  cardiomediastinal contours accounting for portable technique and patient rotation. No acute osseous or soft tissue abnormality. Degenerative changes are present in the imaged spine and shoulders. IMPRESSION: 1. Streaky basilar opacity, may reflect atelectasis or early infection. Electronically Signed   By: Coralie KeensPrice  DeHay M.D.  On: 10/01/2019 14:35   Vas [place] Lower Extremity Venous (dvt)  Result Date: 10/06/2019  Lower Venous Study Indications: Pulmonary embolism.  Risk Factors: COVID 19 positive. Limitations: Patient positioning. Comparison Study: No prior studies. Performing Technologist: Chanda Busing RVT  Examination Guidelines: A complete evaluation includes B-mode imaging, spectral Doppler, color Doppler, and power Doppler as needed of all accessible portions of each vessel. Bilateral testing is considered an integral part of a complete examination. Limited examinations for reoccurring indications may be performed as noted.  +---------+---------------+---------+-----------+----------+--------------+ RIGHT    CompressibilityPhasicitySpontaneityPropertiesThrombus Aging +---------+---------------+---------+-----------+----------+--------------+ CFV      Full           Yes      Yes                                 +---------+---------------+---------+-----------+----------+--------------+ SFJ      Full                                                        +---------+---------------+---------+-----------+----------+--------------+ FV Prox  Full                                                        +---------+--------mmary: Right: There is no evidence of deep vein thrombosis in the lower extremity. However, portions of this examination were limited- see technologist comments above. No cystic structure found in the popliteal fossa. Left: Findings consistent with acute deep vein thrombosis involving the left femoral vein, and left popliteal vein. No cystic structure found in the  popliteal fossa.  *See table(s) above for measurements and observations. Electronically signed by Lemar Livings MD on 10/06/2019 at 5:45:34 PM.    Final     Lab Data:  CBC: Recent Labs  Lab 09/20/2019 1448 10/04/2019 2018 10/06/19 0427 10/07/19 0443 10/08/19 0309 10/09/19 0412  WBC 5.9 6.4 6.8 7.6 6.8 9.8  NEUTROABS 4.7  --  5.9 6.6 5.7  --   HGB 16.2 16.8 15.5 13.3 11.2* 12.3*  HCT 49.9 50.6 47.3 40.4 35.7* 38.0*  MCV 87.1 85.6 84.9 86.3 88.6 86.6  PLT 141* 138* 149* 167 229 227   Basic Metabolic Panel: Recent Labs  Lab 10/03/2019 1448 09/28/2019 2018 10/06/19 0427 10/07/19 0443 10/08/19 0309  NA 140  --  135 140 142  K 4.2  --  4.2 4.3 4.1  CL 101  --  103 107 110  CO2 26  --  21* 21* 21*  GLUCOSE 116*  --  166* 142* 153*  BUN 18  --  17 32* 43*  CREATININE 0.80 0.86 0.76 0.96 1.05  CALCIUM 8.5*  --  8.3* 8.4* 8.3*   GFR: Estimated Creatinine Clearance: 58.5 mL/min (by C-G formula based on SCr of 1.05 mg/dL). Liver Function Tests: Recent Labs  Lab 09/20/2019 1448 10/06/19 0427 10/07/19 0443 10/08/19 0309  AST 123* 110* 68* 48*  ALT 60* 61* 49* 43  ALKPHOS 66 60 53 46  BILITOT 0.8 1.2 0.6 0.5  PROT 6.7 6.2* 5.7* 5.3*  ALBUMIN 3.4* 3.0* 2.6* 2.5*   No results for input(s): LIPASE, AMYLASE in the  last 168 hours. No results for input(s): AMMONIA in the last 168 hours. Coagulation Profile: Recent Labs  Lab 10/04/2019 1500  INR 1.0    Recent Labs    10/07/19 0443 10/08/19 0309  FERRITIN 329 314   Urine analysis:    Component Value Date/Time   COLORURINE YELLOW 10/06/2019 1926   APPEARANCEUR CLEAR 10/06/2019 1926   APPEARANCEUR Turbid 08/14/2012 1139   LABSPEC 1.025 09/29/2019 1926   LABSPEC 1.020 08/14/2012 1139   PHURINE 5.0 10/07/2019 1926   GLUCOSEU NEGATIVE 09/24/2019 1926   GLUCOSEU 50 mg/dL 08/14/2012 1139   HGBUR LARGE (A) 10/06/2019 1926   BILIRUBINUR NEGATIVE 09/23/2019 1926   BILIRUBINUR Negative 08/14/2012 1139   KETONESUR 5 (A) 10/11/2019  1926   PROTEINUR 100 (A) 10/09/2019 1926   UROBILINOGEN 0.2 05/04/2011 1135   NITRITE NEGATIVE 09/24/2019 1926   LEUKOCYTESUR NEGATIVE 10/10/2019 1926   LEUKOCYTESUR Negative 08/14/2012 1139     Laurencia Roma A Praveen Coia M.D. [facility] 10/09/2019, 11:50 AM   Call night coverage person covering after 7pm

## 2019-10-09 NOTE — Progress Notes (Signed)
Son able to visit father. Son wore appropriate PPE and disposed of equipment properly. He understood the risks of coming to see father. Answered all questions for patient and family. Son was in room for about 10 minutes.

## 2019-10-10 DIAGNOSIS — U071 COVID-19: Secondary | ICD-10-CM | POA: Diagnosis not present

## 2019-10-10 DIAGNOSIS — J9600 Acute respiratory failure, unspecified whether with hypoxia or hypercapnia: Secondary | ICD-10-CM | POA: Diagnosis not present

## 2019-10-10 LAB — CULTURE, BLOOD (ROUTINE X 2)
Culture: NO GROWTH
Culture: NO GROWTH
Special Requests: ADEQUATE
Special Requests: ADEQUATE

## 2019-10-10 LAB — CBC
HCT: 39.1 % (ref 39.0–52.0)
Hemoglobin: 11.6 g/dL — ABNORMAL LOW (ref 13.0–17.0)
MCH: 27.8 pg (ref 26.0–34.0)
MCHC: 29.7 g/dL — ABNORMAL LOW (ref 30.0–36.0)
MCV: 93.5 fL (ref 80.0–100.0)
Platelets: 194 10*3/uL (ref 150–400)
RBC: 4.18 MIL/uL — ABNORMAL LOW (ref 4.22–5.81)
RDW: 15.9 % — ABNORMAL HIGH (ref 11.5–15.5)
WBC: 8.2 10*3/uL (ref 4.0–10.5)
nRBC: 0.2 % (ref 0.0–0.2)

## 2019-10-10 LAB — HEPARIN LEVEL (UNFRACTIONATED): Heparin Unfractionated: 0.38 IU/mL (ref 0.30–0.70)

## 2019-10-10 NOTE — Progress Notes (Signed)
ANTICOAGULATION CONSULT NOTE   Pharmacy Consult for heparin Indication: PE/DVT  No Known Allergies  Patient Measurements: Height: 6' (182.9 cm) Weight: 185 lb 6.5 oz (84.1 kg) IBW/kg (Calculated) : 77.6 Heparin Dosing Weight = total body weight  Temp: 98.2 F (36.8 C) (11/24 0301) Temp Source: Oral (11/24 0301) BP: 142/95 (11/24 0301) Pulse Rate: 70 (11/24 0301)  Labs: Recent Labs    10/08/19 0309 10/09/19 0412 10/10/19 0446  HGB 11.2* 12.3* 11.6*  HCT 35.7* 38.0* 39.1  PLT 229 227 194  HEPARINUNFRC 0.51 0.51 0.38  CREATININE 1.05  --   --     Estimated Creatinine Clearance: 58.5 mL/min (by C-G formula based on SCr of 1.05 mg/dL).  Assessment: Pharmacy consulted to dose/monitor heparin in this 85 year old male. Patient has PMH significant for CVA, afib, dementia. Pt was not taking anticoagulants PTA per med rec/MAR. Patient is COVID+, CT chest positive for acute bilateral PE with CT evidence of right heart strain consistent with submassive PE. Lowe extremity doppler + for  LLE DVT.   Baseline labs:  Hgb 16.2  Plt 141  D-dimer: 19.06  INR: 1  Today, 10/10/19:  Heparin level = 0.38 units/mL, therapeutic  Hgb 11.6, Pltc WNL  No bleeding or infusion issues reported per nursing.    Goal of Therapy:  Heparin level 0.3-0.7 units/ml Monitor platelets by anticoagulation protocol: Yes   Plan: Continue heparin infusion at 1300 units/hr Daily heparin level & CBC while on heparin Monitor for signs/symptoms of bleeding    Royetta Asal, PharmD, BCPS 10/10/2019 8:58 AM

## 2019-10-10 NOTE — Progress Notes (Signed)
[facility]                                                                              [name] Demographics  [name], is a 85 y.o. male, DOB - August 06, 1934, TFT:[id number]  Admit date - 10/04/2019   Admitting Physician Alejandro Jenna Luo, MD  Outpatient Primary MD for the [name] is [name], No Pcp Per  Outpatient specialists:   LOS - 5  days   Medical records reviewed and are as summarized below:    Chief Complaint  [name] presents with  . COVID +       Brief summary   [name] is a 85 year old male with history of CVA, paroxysmal atrial fibrillation, dementia, currently resides in [facility] skilled nursing facility.  [name] was tested positive for Covid last week and then he was retested again 2 days prior to admission and remained positive.  Facility noted that [name] was much more lethargic than his usual, has dementia at baseline.  He was sent to the ER for further work-up. At the time of my examination, [name] opens eyes with sternal rub but does not respond to any questions.  No ongoing respiratory distress, vomiting or diarrhea.  D-dimer 19.06, fibrinogen 529, COVID-19 test positive CT angiogram of the chest showed bilateral pulmonary embolism with right heart strain, submassive PE    Subjective:   [name] was seen and examined this morning, remains on comfort care measures, resting comfortably in bed.  Difficult to arouse. Remains hemodynamically stable.  Currently satting 91% on room air. Per nursing staff [name]'s son visited yesterday.   Palliative care team following closely   Assessment & Plan    Principal Problem:  Acute Hypoxic Resp. Failure due to Acute Covid 19 Viral Pneumonitis during the ongoing 2020 Covid 19 Pandemic - POA -Under comfort care measures, remains comfortable, oxygen has been weaned down, currently on room air satting 91%,  -Appreciate palliative following, continue current comfort care measures  medications including as needed morphine,    On arrival coarse breath sounds throughout.  [name] was placed on NRB at 15 L.  Heart rate elevated 140s to 160s.   -[name] continues to need suctioning for excessive secretion, likely lead to aspiration  -D-dimer 19.06 at the time of admission,   CT angiogram of the chest showed bilateral PE, placed on heparin drip -[name] was placed on IV remdesivir and dexamethasone per protocol - Has + sick contact (from skilled nursing facility) -Continue inhalers, cough suppressants, vitamins, supportive treatment - Continue to follow labs as below  Recent Labs  Lab 10/10/2019 1448 09/25/2019 1450 October 14, 2019 2018 10/06/19 0427 10/07/19 0443 10/08/19 0309  DDIMER 19.06*  --   --  8.95* 5.12* 3.64*  FERRITIN  --  322  --  393* 329 314  CRP  --  7.1*  --  9.4* 8.0* 3.6*  ALT 60*  --   --  61* 49* 43  PROCALCITON <0.10  --  <0.10  --   --   --      Active Problems:   Acute pulmonary embolism, bilateral (HCC), acute DVT L LE -Hypoxia improved, was briefly  taken off heparin as [name] is status improved, for palliative reason IV heparin was restarted for now.  -Likely due to #1, CT angiogram of the chest showed bilateral PE with right heart strain -2D echo showed EF of 55 to 60%, no WMA - on IV heparin drip  Acute metabolic encephalopathy superimposed on dementia, multiinfarct, with behavioral disturbance ([place]), FTT -[name] remains encephalopathic -with minimal response of this this a.m. -[name] has history of advanced dementia, CT head showed no acute infarct or bleeding -Unresponsive, not following any commands, in respiratory distress and hypoxia  Aspiration, dysphagia in the setting of advanced dementia SLP evaluation was obtained on 11/21 had recommended dysphagia 1 diet with nectar thick liquids.  Goals of care -[name]'s son, Alejandro, Andrews Dr. Kirstie Mirza help, [name] transitioned to comfort care, actively dying -After  initiating palliative care-morphine [name] status slightly improved, O2 sats heart rate have stabilized-it was decided to restart the heparin drip for now -We will continue to update family with [name] status as he remains encephalopathic unresponsive   Code Status: DNR/DNI DVT Prophylaxis:  Heparin gtt will be D/Ced today -discussed with the [name]'s son who agrees to discontinue heparin drip continue with comfort care measures. Family Communication: discussed with [name]'s son [name]  Called and updated today 10/10/2019.  Family had extensive discussion with palliative care team Dr. Domingo Cocking.  They are on board with comfort care measures.   Disposition Plan: Full comfort care status  actively dying, likely hospital death  Time Spent in minutes   28mins  Procedures:  CT angiogram of the chest  Consultants:   Palliative medicine  Antimicrobials:   Anti-infectives (From admission, onward)   Start     Dose/Rate Route Frequency Ordered Stop   10/07/19 1000  remdesivir 100 mg in sodium chloride 0.9 % 250 mL IVPB  Status:  Discontinued     100 mg 500 mL/hr over 30 Minutes Intravenous Every 24 hours 10/06/19 0701 10/08/19 0938   10/06/19 1000  remdesivir 200 mg in sodium chloride 0.9 % 250 mL IVPB     200 mg 500 mL/hr over 30 Minutes Intravenous Once 10/06/19 0701 10/06/19 1033         Medications  Scheduled Meds: . glycopyrrolate  0.2 mg Intravenous Q8H   Continuous Infusions: . heparin 1,300 Units/hr (10/10/19 0545)   PRN Meds:.acetaminophen **OR** acetaminophen, antiseptic oral rinse, chlorpheniramine-HYDROcodone, glycopyrrolate **OR** glycopyrrolate **OR** glycopyrrolate, haloperidol **OR** haloperidol **OR** haloperidol lactate, LORazepam, morphine injection, ondansetron **OR** ondansetron (ZOFRAN) IV, polyvinyl alcohol      Objective:   Vitals:   10/09/19 0809 10/09/19 1457 10/09/19 2123 10/10/19 0301  BP:  105/72 122/80 (!) 142/95  Pulse:  60 70 70   Resp:  20 16 20   Temp:  97.9 F (36.6 C) 98.1 F (36.7 C) 98.2 F (36.8 C)  TempSrc:  Axillary Axillary Oral  SpO2: 94% 96% 98% 91%  Weight:      Height:        Intake/Output Summary (Last 24 hours) at 10/10/2019 1150 Last data filed at 10/10/2019 1000 Gross per 24 hour  Intake 124.57 ml  Output 750 ml  Net -625.43 ml     Wt Readings from Last 3 Encounters:  09/23/2019 84.1 kg  07/13/19 80 kg  08/31/18 80.7 kg  BP (!) 142/95 (BP Location: Right Arm)   Pulse 70   Temp 98.2 F (36.8 C) (Oral)   Resp 20   Ht 6' (1.829 m)   Wt 84.1 kg   SpO2 91%  BMI 25.15 kg/m    Physical Exam  No changes [name] remained to be somnolent, difficult to arouse this morning. Constitution: Unresponsive, withdraws to pain stimuli, resting comfortably in bed Psychiatric: Unresponsive HEENT: Limited exam eyes are closed,  Chest:Chest symmetric Cardio vascular:  S1/S2, RRR, No murmure, No Rubs or Gallops  pulmonary: Clear to auscultation bilaterally, respirations unlabored, negative wheezes / crackles Abdomen: Soft, non-tender, non-distended, bowel sounds,no masses, no organomegaly Muscular skeletal: Limited exam - in bed Neuro: Limited [name] is resting comfortably but unresponsive Extremities: No pitting edema lower extremities, +2 pulses  Skin: Dry, warm to touch, negative for any Rashes, No open wounds Wounds: per nursing documentation  Data Reviewed:  I have personally reviewed following labs and imaging studies  Micro Results Recent Results (from the past 240 hour(s))  Blood Culture (routine x 2)     Status: None (Preliminary result)   Collection Time: 09/20/2019  3:30 PM   Specimen: BLOOD  Result Value Ref Range Status   Specimen Description   Final    BLOOD RIGHT ANTECUBITAL Performed at [facility], [address] [place], [place] [address]    Special Requests   Final    BOTTLES DRAWN AEROBIC AND ANAEROBIC Blood Culture adequate volume Performed at  [facility], [address] [place], [place] [address]    Culture   Final    NO GROWTH 4 DAYS Performed at [facility], [address] [place], [place] [address]    Report Status PENDING  Incomplete  Urine culture     Status: None   Collection Time: 09/30/2019  7:26 PM   Specimen: Urine, Random  Result Value Ref Range Status   Specimen Description   Final    URINE, RANDOM Performed at [facility], [address], [place], [place] [address]    Special Requests   Final    NONE Performed at [facility], [address], [place], [place] [address]    Culture   Final    NO GROWTH Performed at [facility], [address] [place], [place] [address]    Report Status 10/07/2019 FINAL  Final  Blood Culture (routine x 2)     Status: None (Preliminary result)   Collection Time: 09/21/2019  8:18 PM   Specimen: BLOOD LEFT ARM  Result Value Ref Range Status   Specimen Description   Final    BLOOD LEFT ARM Performed at [facility], [address], [place], [place] [address]    Special Requests   Final    BOTTLES DRAWN AEROBIC AND ANAEROBIC Blood Culture adequate volume Performed at [facility], [address] [place], [place] [address]    Culture   Final    NO GROWTH 4 DAYS Performed at [facility], [address] [place], [place] [address]    Report Status PENDING  Incomplete    Radiology Reports Ct Head Wo Contrast  Result Date: 10/04/2019 CLINICAL DATA:  Lethargy.  Altered mental status. EXAM: CT HEAD WITHOUT CONTRAST TECHNIQUE: Contiguous axial images were obtained from the base of the skull through the vertex without intravenous contrast. COMPARISON:  Brain CT 07/13/2019 FINDINGS: Brain: Ventricles and sulci are prominent compatible with atrophy. No evidence for acute cortically based infarct, intracranial hemorrhage, mass lesion or mass-effect. Focal encephalomalacia within  the right periventricular white matter. 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 Chronic microvascular ischemic changes.  Vascular: Unremarkable. Skull: Intact. Sinuses/Orbits: Paranasal sinuses are well aerated. Mastoid air cells are unremarkable. Other: None. IMPRESSION: No acute intracranial process. Atrophy and chronic microvascular ischemic changes. Electronically Signed   By: Annia Belt M.D.   On: 09/30/2019 18:39   Ct Angio Chest Pe W And/or Wo Contrast  Result Date: 10/04/2019 CLINICAL DATA:  Chest pain. Altered mental status. Coronavirus infection. EXAM: CT ANGIOGRAPHY CHEST WITH CONTRAST TECHNIQUE: Multidetector CT imaging of the chest was performed using the standard protocol during bolus administration of intravenous contrast. Multiplanar CT image reconstructions and MIPs were obtained to evaluate the vascular anatomy. CONTRAST:  OMNIPAQUE IOHEXOL 350 MG/ML SOLN COMPARISON:  Chest radiography same day FINDINGS: Cardiovascular: Pulmonary opacification is excellent. The [name] demonstrates extensive bilateral pulmonary emboli including saddle embolus. Heart size is normal but right ventricular to left ventricular ratio is abnormal at 1.2 and therefore suggests submassive disease with right ventricular strain. There is coronary artery calcification. There is ordinary aortic atherosclerosis. Mediastinum/Nodes: No hilar or mediastinal mass or lymphadenopathy. Lungs/Pleura: Bibasilar pulmonary atelectasis. No identifiable pulmonary infiltrate. No mass lesion. Upper Abdomen: Negative Musculoskeletal: Ordinary degenerative changes affect the spine. Review of the MIP images confirms the above findings. IMPRESSION: Positive for acute bilateral PE with CT evidence of right heart strain (RV/LV Ratio = 1.2) consistent with at least submassive (intermediate risk) PE. The presence of right heart strain has been associated with an increased risk of morbidity and  mortality. Please activate Code PE by paging [phone]. Coronary artery calcification. Aortic atherosclerosis. Mild basilar atelectasis. Critical Value/emergent results were called by telephone at the time of interpretation on 09/30/2019 at 6:43 pm to providerDr.Long, Who verbally acknowledged these results. Electronically Signed   By: Paulina Fusi M.D.   On: 09/25/2019 18:45   Dg Chest Port 1 View  Result Date: 10/07/2019 CLINICAL DATA:  Dyspnea EXAM: PORTABLE CHEST 1 VIEW COMPARISON:  Chest radiograph from one day prior. FINDINGS: Stable cardiomediastinal silhouette with normal heart size. No pneumothorax. No pleural effusion. Minimal right basilar scarring, unchanged. No pulmonary edema. No acute consolidative airspace disease. IMPRESSION: Stable minimal right basilar scarring. Otherwise no active disease in the chest. Electronically Signed   By: Delbert Phenix M.D.   On: 10/07/2019 19:51   Dg Chest Port 1 View  Result Date: 10/06/2019 CLINICAL DATA:  Tachypnea. Decreased oxygen saturation. COVID-19 positive. Diagnosed with pulmonary embolus yesterday. EXAM: PORTABLE CHEST 1 VIEW COMPARISON:  Radiograph and CT yesterday. FINDINGS: Heart is normal in size with normal mediastinal contours. Minor right lung base atelectasis. Improved left basilar atelectasis from prior radiograph no pulmonary edema, pleural effusion or pneumothorax. No acute osseous abnormalities. IMPRESSION: Improve left and unchanged right lung base atelectasis. No new abnormalities. Electronically Signed   By: Narda Rutherford M.D.   On: 10/06/2019 06:24   Dg Chest Port 1 View  Result Date: 10/02/2019 CLINICAL DATA:  COVID-19 positive EXAM: PORTABLE CHEST 1 VIEW COMPARISON:  Radiograph July 13, 2019 FINDINGS: Streaky basilar opacities may reflect atelectasis or early infection. No pneumothorax or effusion. Stable cardiomediastinal contours accounting for portable technique and [name] rotation. No acute osseous or soft tissue  abnormality. Degenerative changes are present in the imaged spine and shoulders. IMPRESSION: 1. Streaky basilar opacity, may reflect atelectasis or early infection. Electronically Signed   By: Kreg Shropshire M.D.   On: 10/09/2019 14:35   Vas [place] Lower Extremity Venous (dvt)  Result Date: 10/06/2019  Lower Venous Study Indications: Pulmonary embolism.  Risk Factors: COVID 19 positive. Limitations: [name] positioning.  Comparison Study: No prior studies. Performing Technologist: Chanda BusingGregory Collins RVT  Examination Guidelines: A complete evaluation includes B-mode imaging, spectral Doppler, color Doppler, and power Doppler as needed of all accessible portions of each vessel. Bilateral testing is considered an integral part of a complete examination. Limited examinations for reoccurring indications may be performed as noted.  +---------+---------------+---------+-----------+----------+--------------+ RIGHT    CompressibilityPhasicitySpontaneityPropertiesThrombus Aging +---------+---------------+---------+-----------+----------+--------------+ CFV      Full           Yes      Yes                                 +---------+---------------+---------+-----------+----------+--------------+ SFJ      Full                                                        +---------+---------------+---------+-----------+----------+--------------+ FV Prox  Full                                                        +---------+--------mmary: Right: There is no evidence of deep vein thrombosis in the lower extremity. However, portions of this examination were limited- see technologist comments above. No cystic structure found in the popliteal fossa. Left: Findings consistent with acute deep vein thrombosis involving the left femoral vein, and left popliteal vein. No cystic structure found in the popliteal fossa.  *See table(s) above for measurements and observations. Electronically signed by Lemar LivingsBrandon Cain MD on  10/06/2019 at 5:45:34 PM.    Final     Lab Data:  CBC: Recent Labs  Lab 10/13/2019 1448  10/06/19 0427 10/07/19 0443 10/08/19 0309 10/09/19 0412 10/10/19 0446  WBC 5.9   < > 6.8 7.6 6.8 9.8 8.2  NEUTROABS 4.7  --  5.9 6.6 5.7  --   --   HGB 16.2   < > 15.5 13.3 11.2* 12.3* 11.6*  HCT 49.9   < > 47.3 40.4 35.7* 38.0* 39.1  MCV 87.1   < > 84.9 86.3 88.6 86.6 93.5  PLT 141*   < > 149* 167 229 227 194   < > = values in this interval not displayed.   Basic Metabolic Panel: Recent Labs  Lab 09/28/2019 1448 10/04/2019 2018 10/06/19 0427 10/07/19 0443 10/08/19 0309  NA 140  --  135 140 142  K 4.2  --  4.2 4.3 4.1  CL 101  --  103 107 110  CO2 26  --  21* 21* 21*  GLUCOSE 116*  --  166* 142* 153*  BUN 18  --  17 32* 43*  CREATININE 0.80 0.86 0.76 0.96 1.05  CALCIUM 8.5*  --  8.3* 8.4* 8.3*   GFR: Estimated Creatinine Clearance: 58.5 mL/min (by C-G formula based on SCr of 1.05 mg/dL). Liver Function Tests: Recent Labs  Lab 10/11/2019 1448 10/06/19 0427 10/07/19 0443 10/08/19 0309  AST 123* 110* 68* 48*  ALT 60* 61* 49* 43  ALKPHOS 66 60 53 46  BILITOT 0.8 1.2 0.6 0.5  PROT 6.7 6.2* 5.7* 5.3*  ALBUMIN 3.4* 3.0* 2.6* 2.5*   No results for input(s):  LIPASE, AMYLASE in the last 168 hours. No results for input(s): AMMONIA in the last 168 hours. Coagulation Profile: Recent Labs  Lab 09/18/2019 1500  INR 1.0    Recent Labs    10/08/19 0309  FERRITIN 314   Urine analysis:    Component Value Date/Time   COLORURINE YELLOW 10/09/2019 1926   APPEARANCEUR CLEAR 10/04/2019 1926   APPEARANCEUR Turbid 08/14/2012 1139   LABSPEC 1.025 10/09/2019 1926   LABSPEC 1.020 08/14/2012 1139   PHURINE 5.0 10/13/2019 1926   GLUCOSEU NEGATIVE 09/20/2019 1926   GLUCOSEU 50 mg/dL 16/08/9603 5409   HGBUR LARGE (A) 10/08/2019 1926   BILIRUBINUR NEGATIVE 09/20/2019 1926   BILIRUBINUR Negative 08/14/2012 1139   KETONESUR 5 (A) 09/24/2019 1926   PROTEINUR 100 (A) 09/21/2019 1926    UROBILINOGEN 0.2 05/04/2011 1135   NITRITE NEGATIVE 09/18/2019 1926   LEUKOCYTESUR NEGATIVE 09/17/2019 1926   LEUKOCYTESUR Negative 08/14/2012 1139     Sonnie Bias A Kori Colin M.D. [facility] 10/10/2019, 11:50 AM   Call night coverage person covering after 7pm

## 2019-10-11 DIAGNOSIS — Z515 Encounter for palliative care: Secondary | ICD-10-CM | POA: Diagnosis not present

## 2019-10-11 DIAGNOSIS — Z7189 Other specified counseling: Secondary | ICD-10-CM | POA: Diagnosis not present

## 2019-10-11 DIAGNOSIS — U071 COVID-19: Secondary | ICD-10-CM | POA: Diagnosis not present

## 2019-10-11 DIAGNOSIS — J9600 Acute respiratory failure, unspecified whether with hypoxia or hypercapnia: Secondary | ICD-10-CM | POA: Diagnosis not present

## 2019-10-11 DIAGNOSIS — R0602 Shortness of breath: Secondary | ICD-10-CM

## 2019-10-11 LAB — CBC
HCT: 38.6 % — ABNORMAL LOW (ref 39.0–52.0)
Hemoglobin: 12 g/dL — ABNORMAL LOW (ref 13.0–17.0)
MCH: 28 pg (ref 26.0–34.0)
MCHC: 31.1 g/dL (ref 30.0–36.0)
MCV: 90.2 fL (ref 80.0–100.0)
Platelets: 184 10*3/uL (ref 150–400)
RBC: 4.28 MIL/uL (ref 4.22–5.81)
RDW: 16.3 % — ABNORMAL HIGH (ref 11.5–15.5)
WBC: 10.4 10*3/uL (ref 4.0–10.5)
nRBC: 0.3 % — ABNORMAL HIGH (ref 0.0–0.2)

## 2019-10-11 LAB — HEPARIN LEVEL (UNFRACTIONATED): Heparin Unfractionated: <0.1 IU/mL — ABNORMAL LOW (ref 0.30–0.70)

## 2019-10-11 MED ORDER — MORPHINE 100MG IN NS 100ML (1MG/ML) PREMIX INFUSION
2.0000 mg/h | INTRAVENOUS | Status: DC
  Administered 2019-10-11: 2 mg/h via INTRAVENOUS
  Administered 2019-10-12 – 2019-10-13 (×2): 4 mg/h via INTRAVENOUS
  Filled 2019-10-11 (×4): qty 100

## 2019-10-11 MED ORDER — MORPHINE BOLUS VIA INFUSION
2.0000 mg | INTRAVENOUS | Status: DC | PRN
  Filled 2019-10-11: qty 2

## 2019-10-11 NOTE — TOC Progression Note (Signed)
Transition of Care (TOC) - Progression Note    Patient Details  Name: [name]. MRN: [id number] Date of Birth: 29-Aug-1934  Transition of Care [facility]) CM/SW Contact  Purcell Mouton, RN Phone Number: 10/11/2019, 3:49 PM  Clinical Narrative:    [facility].         Expected Discharge Plan and Services                                                 Social Determinants of Health (SDOH) Interventions    Readmission Risk Interventions No flowsheet data found.

## 2019-10-11 NOTE — Progress Notes (Signed)
PROGRESS NOTE    Alejandro SalvoEdward A Kai Jr.  ZOX:[id number] DOB: Sep 22, 1934 DOA: 2019/05/24 PCP: Patient, No Pcp Per    Brief Narrative:  85 year old gentleman with history of a stroke, paroxysmal A. fib, dementia, current resident of long-term nursing home who was tested positive for Covid last week.  Patient was noted to be more and more lethargic with baseline dementia and sent to ER for further work-up.  In the emergency room repeat COVID-19 positive.  CTA showed bilateral PE with right heart strain.   Assessment & Plan:   Principal Problem:   Acute respiratory failure due to COVID-19 [facility](HCC) Active Problems:   Dementia, multiinfarct, with behavioral disturbance (HCC)   Pulmonary embolism, bilateral (HCC)   Acute metabolic encephalopathy  Acute hypoxic respiratory failure due to COVID-19 pneumonia: Bilateral submassive pulmonary embolism with right heart strain: Acute metabolic encephalopathy in the setting of underlying dementia: Persistently encephalopathic Aspiration, dysphagia in the setting of advanced dementia:  Patient with severe comorbidities, advanced dementia, persistent encephalopathy. Decision made to start on comfort care measures. All comfort care medications available. DNR/DNI. Will explore options if he can go to inpatient hospice, however with positive Covid test may have difficult placement. Patient may die in the hospital. No lab draws, no vital signs. RN to pronounce death if occurs in the hospital.  DVT prophylaxis: Comfort care Code Status: Comfort care Family Communication: None, patient son was communicated by palliative care medicine. Disposition Plan: Hospital death expected.  If possible, will explore bed at inpatient hospice.   Consultants:   Palliative medicine  Procedures:   None  Antimicrobials:   None   Subjective: Patient seen and examined.  No overnight events.  Patient was without any discomfort.  Unresponsive and obtunded.   Objective: Vitals:   10/10/19 0301 10/10/19 2253 10/11/19 0532 10/11/19 0808  BP: (!) 142/95 (!) 148/75 118/79   Pulse: 70 (!) 108 (!) 158   Resp: 20 (!) 24 20   Temp: 98.2 F (36.8 C) 99.3 F (37.4 C) 99.1 F (37.3 C)   TempSrc: Oral Oral Oral   SpO2: 91%   90%  Weight:      Height:        Intake/Output Summary (Last 24 hours) at 10/11/2019 1503 Last data filed at 10/10/2019 2000 Gross per 24 hour  Intake -  Output 75 ml  Net -75 ml   Filed Weights   09/08/19 1946  Weight: 84.1 kg    Examination:  General exam: Appears comfortable.  obtunded.  Not reacting to painful stimuli. Respiratory system: Clear to auscultation. Respiratory effort normal.  On minimal oxygen for comfort. Cardiovascular system: S1 & S2 heard, RRR.  Gastrointestinal system: Abdomen is nondistended, soft and nontender.    Data Reviewed: I have personally reviewed following labs and imaging studies  CBC: Recent Labs  Lab 09/08/19 1448  10/06/19 0427 10/07/19 0443 10/08/19 0309 10/09/19 0412 10/10/19 0446 10/11/19 0416  WBC 5.9   < > 6.8 7.6 6.8 9.8 8.2 10.4  NEUTROABS 4.7  --  5.9 6.6 5.7  --   --   --   HGB 16.2   < > 15.5 13.3 11.2* 12.3* 11.6* 12.0*  HCT 49.9   < > 47.3 40.4 35.7* 38.0* 39.1 38.6*  MCV 87.1   < > 84.9 86.3 88.6 86.6 93.5 90.2  PLT 141*   < > 149* 167 229 227 194 184   < > = values in this interval not displayed.   Basic Metabolic Panel: Recent  Labs  Lab October 28, 2019 1448 2019-10-28 2018 10/06/19 0427 10/07/19 0443 10/08/19 0309  NA 140  --  135 140 142  K 4.2  --  4.2 4.3 4.1  CL 101  --  103 107 110  CO2 26  --  21* 21* 21*  GLUCOSE 116*  --  166* 142* 153*  BUN 18  --  17 32* 43*  CREATININE 0.80 0.86 0.76 0.96 1.05  CALCIUM 8.5*  --  8.3* 8.4* 8.3*   GFR: Estimated Creatinine Clearance: 58.5 mL/min (by C-G formula based on SCr of 1.05 mg/dL). Liver Function Tests: Recent Labs  Lab 10/28/19 1448 10/06/19 0427 10/07/19 0443 10/08/19 0309  AST 123*  110* 68* 48*  ALT 60* 61* 49* 43  ALKPHOS 66 60 53 46  BILITOT 0.8 1.2 0.6 0.5  PROT 6.7 6.2* 5.7* 5.3*  ALBUMIN 3.4* 3.0* 2.6* 2.5*   No results for input(s): LIPASE, AMYLASE in the last 168 hours. No results for input(s): AMMONIA in the last 168 hours. Coagulation Profile: Recent Labs  Lab 2019/10/28 1500  INR 1.0   Cardiac Enzymes: No results for input(s): CKTOTAL, CKMB, CKMBINDEX, TROPONINI in the last 168 hours. BNP (last 3 results) No results for input(s): PROBNP in the last 8760 hours. HbA1C: No results for input(s): HGBA1C in the last 72 hours. CBG: No results for input(s): GLUCAP in the last 168 hours. Lipid Profile: No results for input(s): CHOL, HDL, LDLCALC, TRIG, CHOLHDL, LDLDIRECT in the last 72 hours. Thyroid Function Tests: No results for input(s): TSH, T4TOTAL, FREET4, T3FREE, THYROIDAB in the last 72 hours. Anemia Panel: No results for input(s): VITAMINB12, FOLATE, FERRITIN, TIBC, IRON, RETICCTPCT in the last 72 hours. Sepsis Labs: Recent Labs  Lab 2019/10/28 1448 10-28-19 1547 28-Oct-2019 2018  PROCALCITON <0.10  --  <0.10  LATICACIDVEN  --  1.5 1.9    Recent Results (from the past 240 hour(s))  Blood Culture (routine x 2)     Status: None   Collection Time: October 28, 2019  3:30 PM   Specimen: BLOOD  Result Value Ref Range Status   Specimen Description   Final    BLOOD RIGHT ANTECUBITAL Performed at [facility], [address], [place], [place] [address]    Special Requests   Final    BOTTLES DRAWN AEROBIC AND ANAEROBIC Blood Culture adequate volume Performed at [facility], [address], [place], [place] [address]    Culture   Final    NO GROWTH 5 DAYS Performed at [facility], [address], [place], [place] [address]    Report Status 10/10/2019 FINAL  Final  Urine culture     Status: None   Collection Time: October 28, 2019  7:26 PM   Specimen: Urine, Random  Result Value Ref Range Status   Specimen  Description   Final    URINE, RANDOM Performed at [facility], [address], [place], [place] [address]    Special Requests   Final    NONE Performed at [facility], [address] [place], [place] [address]    Culture   Final    NO GROWTH Performed at [facility], [address], [place], [place] [address]    Report Status 10/07/2019 FINAL  Final  Blood Culture (routine x 2)     Status: None   Collection Time: 10/28/2019  8:18 PM   Specimen: BLOOD LEFT ARM  Result Value Ref Range Status   Specimen Description  Final    BLOOD LEFT ARM Performed at [facility], [place] [address] [place], [place] [address]    Special Requests   Final    BOTTLES DRAWN AEROBIC AND ANAEROBIC Blood Culture adequate volume Performed at [place] [address] [place], [place] [address]    Culture   Final    NO GROWTH 5 DAYS Performed at [facility], [place] [address], [place], [place] [address]    Report Status 10/10/2019 FINAL  Final         Radiology Studies: No results found.      Scheduled Meds: . glycopyrrolate  0.2 mg Intravenous Q8H   Continuous Infusions: . morphine 2 mg/hr (10/11/19 1120)     LOS: 6 days    Time spent: 25 minutes     Barb Merino, MD [facility] Pager [phone]

## 2019-10-11 NOTE — Progress Notes (Addendum)
Daily Progress Note   Patient Name: [name].       Date: 10/11/2019 DOB: 10-25-1934  Age: 85 y.o. MRN#: [id number] Attending Physician: Dorcas Carrow, MD Primary Care Physician: Patient, No Pcp Per Admit Date: 09/21/2019  Reason for Consultation/Follow-up: Terminal Care  Subjective:  patient seen this morning, in no distress.  Resting comfortably, but he has required several doses of IV Morphine overnight. Med history noted and shared with son on the phone.  Shallow regular respirations, he does not have non verbal gestures of distress or discomfort ( no wincing, no grimacing, no furrowing of the brow, no restless movements noted) Call placed and updated son [name] this am, I had also touched base with him in the evening hours on 10-10-2019.  Discussed with son on the phone about the patient's current condition, current medication list and his overall care.  Primary goal remains to focus exclusively on comfort measures.   Length of Stay: 6  Current Medications: Scheduled Meds:  . glycopyrrolate  0.2 mg Intravenous Q8H    Continuous Infusions:   PRN Meds: acetaminophen **OR** acetaminophen, antiseptic oral rinse, chlorpheniramine-HYDROcodone, glycopyrrolate **OR** glycopyrrolate **OR** glycopyrrolate, haloperidol **OR** haloperidol **OR** haloperidol lactate, LORazepam, morphine injection, ondansetron **OR** ondansetron (ZOFRAN) IV, polyvinyl alcohol  Physical Exam         Weak appearing gentleman Shallow breathing Appears pale No edema Regular Abdomen not distended Not awake not alert  Vital Signs: BP 118/79 (BP Location: Left Arm)   Pulse (!) 158   Temp 99.1 F (37.3 C) (Oral)   Resp 20   Ht 6' (1.829 m)   Wt 84.1 kg   SpO2 90%   BMI 25.15 kg/m  SpO2:  SpO2: 90 % O2 Device: O2 Device: Room Air O2 Flow Rate: O2 Flow Rate (L/min): 4 L/min  Intake/output summary:   Intake/Output Summary (Last 24 hours) at 10/11/2019 1015 Last data filed at 10/10/2019 2000 Gross per 24 hour  Intake -  Output 75 ml  Net -75 ml   LBM: Last BM Date: 10/06/19 Baseline Weight: Weight: 84.1 kg Most recent weight: Weight: 84.1 kg       Palliative Assessment/Data:    Flowsheet Rows     Most Recent Value  Intake Tab  Referral Department  Hospitalist  Unit at Time of Referral  Med/Surg  Unit  Palliative Care Primary Diagnosis  Sepsis/Infectious Disease  Date Notified  10/06/19  Palliative Care Type  New Palliative care  Reason for referral  Clarify Goals of Care  Date of Admission  2019/11/04  Date first seen by Palliative Care  10/06/19  # of days Palliative referral response time  0 Day(s)  # of days IP prior to Palliative referral  1  Clinical Assessment  Palliative Performance Scale Score  10%  Psychosocial & Spiritual Assessment  Palliative Care Outcomes  Patient/Family meeting held?  Yes  Who was at the meeting?  Sons via phone      Patient Active Problem List   Diagnosis Date Noted  . Pulmonary embolism, bilateral ([place]) 10/06/2019  . Acute metabolic encephalopathy 99/83/3825  . Acute respiratory failure due to COVID-19 ([place]) November 04, 2019  . Dementia, multiinfarct, with behavioral disturbance ([place]) 07/08/2018    Palliative Care Assessment & Plan   Patient Profile:  Patient is a 85 year old male with history of CVA, paroxysmal atrial fibrillation, dementia, currently resides in [facility] skilled nursing facility  Assessment: Acute Hypoxic Resp. Failure due to Acute Covid 19 Viral Pneumonitis during the ongoing 2020 Covid 19 Pandemic - POA  Acute PE and DVT  Recommendations/Plan:   will likely start continuous morphine infusion today, discussed in detail with son on the phone.  End of life signs and symptoms discussed in  detail with son on the phone  Also discussed with him about judicious use of opioids and benzodiazepines at this stage  Prognosis hours to some very limited number of days discussed frankly and compassionately with son on the phone. Acknowledged his anticipatory grief over losing his father.   Goals of Care and Additional Recommendations:  Limitations on Scope of Treatment: Full Comfort Care  Code Status:    Code Status Orders  (From admission, onward)         Start     Ordered   10/08/19 0939  Do not attempt resuscitation (DNR)  Continuous    Question Answer Comment  In the event of cardiac or respiratory ARREST Do not call a "code blue"   In the event of cardiac or respiratory ARREST Do not perform Intubation, CPR, defibrillation or ACLS   In the event of cardiac or respiratory ARREST Use medication by any route, position, wound care, and other measures to relive pain and suffering. May use oxygen, suction and manual treatment of airway obstruction as needed for comfort.      10/08/19 0941        Code Status History    Date Active Date Inactive Code Status Order ID Comments User Context   10/06/2019 1121 10/08/2019 0941 DNR [id number]  Micheline Rough, MD Inpatient   2019-11-04 1946 10/06/2019 1121 Full Code [id number]  Mendel Corning, MD Inpatient   Advance Care Planning Activity       Prognosis:   Hours - Days  Discharge Planning:  Anticipated Hospital Death  Care plan was discussed with  [name] on the phone.   Thank you for allowing the Palliative Medicine Team to assist in the care of this patient.   Time In: 9 Time Out: 9.35 Total Time 35 Prolonged Time Billed  no       Greater than 50%  of this time was spent counseling and coordinating care related to the above assessment and plan.  Loistine Chance, MD  Please contact Palliative Medicine Team phone at [phone] for questions and concerns.

## 2019-10-11 NOTE — Care Management Important Message (Signed)
Important Message  Patient Details IM Letter given to [name] RN to present to the Patient Name: [name]. MRN: [id number] Date of Birth: [birth year]   Medicare Important Message Given:  Yes     Kerin Salen 10/11/2019, 11:25 AM

## 2019-10-12 DIAGNOSIS — J9600 Acute respiratory failure, unspecified whether with hypoxia or hypercapnia: Secondary | ICD-10-CM | POA: Diagnosis not present

## 2019-10-12 DIAGNOSIS — Z7189 Other specified counseling: Secondary | ICD-10-CM | POA: Diagnosis not present

## 2019-10-12 DIAGNOSIS — Z515 Encounter for palliative care: Secondary | ICD-10-CM | POA: Diagnosis not present

## 2019-10-12 DIAGNOSIS — R0602 Shortness of breath: Secondary | ICD-10-CM | POA: Diagnosis not present

## 2019-10-12 DIAGNOSIS — U071 COVID-19: Secondary | ICD-10-CM | POA: Diagnosis not present

## 2019-10-12 NOTE — Progress Notes (Signed)
Daily Progress Note   Patient Name: [name].       Date: 10/12/2019 DOB: [birth year]  Age: 85 y.o. MRN#: [id number] Attending Physician: Barb Merino, MD Primary Care Physician: Patient, No Pcp Per Admit Date: 10/13/2019  Reason for Consultation/Follow-up: Terminal Care  Subjective:  patient seen this morning, in no distress.  Medication history noted.  Appears more pale, weak. Some shallow breathing along with apneic spells evident.    Length of Stay: 7  Current Medications: Scheduled Meds:  . glycopyrrolate  0.2 mg Intravenous Q8H    Continuous Infusions: . morphine 4 mg/hr (10/12/19 0523)    PRN Meds: acetaminophen **OR** acetaminophen, antiseptic oral rinse, chlorpheniramine-HYDROcodone, glycopyrrolate **OR** glycopyrrolate **OR** glycopyrrolate, haloperidol **OR** haloperidol **OR** haloperidol lactate, LORazepam, morphine injection, morphine **AND** morphine, ondansetron **OR** ondansetron (ZOFRAN) IV, polyvinyl alcohol  Physical Exam         Weak appearing gentleman Shallow breathing Appears pale No edema Regular Abdomen not distended Not awake not alert  Vital Signs: BP 106/76 (BP Location: Left Arm)   Pulse (!) 157   Temp 100.2 F (37.9 C) (Axillary)   Resp 20   Ht 6' (1.829 m)   Wt 84.1 kg   SpO2 (!) 82%   BMI 25.15 kg/m  SpO2: SpO2: (!) 82 % O2 Device: O2 Device: Room Air O2 Flow Rate: O2 Flow Rate (L/min): 4 L/min  Intake/output summary:   Intake/Output Summary (Last 24 hours) at 10/12/2019 1033 Last data filed at 10/12/2019 0522 Gross per 24 hour  Intake -  Output 125 ml  Net -125 ml   LBM: Last BM Date: 10/06/19 Baseline Weight: Weight: 84.1 kg Most recent weight: Weight: 84.1 kg       Palliative Assessment/Data:     Flowsheet Rows     Most Recent Value  Intake Tab  Referral Department  Hospitalist  Unit at Time of Referral  Med/Surg Unit  Palliative Care Primary Diagnosis  Sepsis/Infectious Disease  Date Notified  10/06/19  Palliative Care Type  New Palliative care  Reason for referral  Clarify Goals of Care  Date of Admission  10/04/2019  Date first seen by Palliative Care  10/06/19  # of days Palliative referral response time  0 Day(s)  # of days IP prior to Palliative referral  1  Clinical Assessment  Palliative Performance Scale Score  10%  Psychosocial & Spiritual Assessment  Palliative Care Outcomes  Patient/Family meeting held?  Yes  Who was at the meeting?  Sons via phone      Patient Active Problem List   Diagnosis Date Noted  . Pulmonary embolism, bilateral (HCC) 10/06/2019  . Acute metabolic encephalopathy 10/06/2019  . Acute respiratory failure due to COVID-19 (HCC) 10-29-2019  . Dementia, multiinfarct, with behavioral disturbance (HCC) 07/08/2018    Palliative Care Assessment & Plan   Patient Profile:  Patient is a 85 year old male with history of CVA, paroxysmal atrial fibrillation, dementia, currently resides in [facility] skilled nursing [facility]  Assessment: Acute Hypoxic Resp. Failure due to Acute Covid 19 Viral Pneumonitis during the ongoing 2020 Covid 19 Pandemic - POA  Acute PE and DVT Actively dying  Recommendations/Plan:   patient to remain on continuous morphine infusion along with bolus options available    Continue current comfort care.   Anticipate hospital death Prognosis hours to some very limited number of days    Goals of Care and Additional Recommendations:  Limitations on Scope of Treatment: Full Comfort Care  Code Status:    Code Status Orders  (From admission, onward)         Start     Ordered   10/08/19 0939  Do not attempt resuscitation (DNR)  Continuous    Question Answer Comment  In the event of cardiac or respiratory  ARREST Do not call a "code blue"   In the event of cardiac or respiratory ARREST Do not perform Intubation, CPR, defibrillation or ACLS   In the event of cardiac or respiratory ARREST Use medication by any route, position, wound care, and other measures to relive pain and suffering. May use oxygen, suction and manual treatment of airway obstruction as needed for comfort.      10/08/19 0941        Code Status History    Date Active Date Inactive Code Status Order ID Comments User Context   10/06/2019 1121 10/08/2019 0941 DNR [id number]  Romie Minus, MD Inpatient   10-29-19 1946 10/06/2019 1121 Full Code [id number]  Cathren Harsh, MD Inpatient   Advance Care Planning Activity       Prognosis:   Hours - Days  Discharge Planning:  Anticipated Hospital Death  Care plan was discussed with   Idolina Primer on the phone.   Thank you for allowing the Palliative Medicine Team to assist in the care of this patient.   Time In: 9 Time Out: 9.35 Total Time 35 Prolonged Time Billed  no       Greater than 50%  of this time was spent counseling and coordinating care related to the above assessment and plan.  Rosalin Hawking, MD  Please contact Palliative Medicine Team phone at [phone] for questions and concerns.

## 2019-10-12 NOTE — Progress Notes (Signed)
PROGRESS NOTE    Alejandro SalvoEdward A Swayne Jr.  ZOX:[id number] DOB: [birth year] DOA: 09/17/2019 PCP: Patient, No Pcp Per    Brief Narrative:  85 year old gentleman with history of a stroke, paroxysmal A. fib, dementia, current resident of long-term nursing home who was tested positive for Covid last week.  Patient was noted to be more and more lethargic with baseline dementia and sent to ER for further work-up.  In the emergency room repeat COVID-19 positive.  CTA showed bilateral PE with right heart strain.   Assessment & Plan:   Principal Problem:   Acute respiratory failure due to COVID-19 [facility](HCC) Active Problems:   Dementia, multiinfarct, with behavioral disturbance (HCC)   Pulmonary embolism, bilateral (HCC)   Acute metabolic encephalopathy  Acute hypoxic respiratory failure due to COVID-19 pneumonia: Bilateral submassive pulmonary embolism with right heart strain: Acute metabolic encephalopathy in the setting of underlying dementia: Persistently encephalopathic Aspiration, dysphagia in the setting of advanced dementia: End-of-life care:  Patient with severe comorbidities, advanced dementia, persistent encephalopathy. Decision made for comfort care measures. All comfort care medications available. DNR/DNI. Hospice facilities do not have Covid beds available. Morphine infusion with bolus morphine available. Anticipate death in the hospital. No lab draws, no vital signs. RN to pronounce death if occurs in the hospital.  DVT prophylaxis: Comfort care Code Status: Comfort care Family Communication: None, patient son was communicated by palliative care medicine. Disposition Plan: Hospital death expected.     Consultants:   Palliative medicine  Procedures:   None  Antimicrobials:   None   Subjective: Patient seen and examined.  No overnight events.  Remains obtunded.  Not in any distress. Objective: Vitals:   10/10/19 2253 10/11/19 0532 10/11/19 0808 10/12/19 0518  BP:  (!) 148/75 118/79  106/76  Pulse: (!) 108 (!) 158  (!) 157  Resp: (!) 24 20    Temp: 99.3 F (37.4 C) 99.1 F (37.3 C)  100.2 F (37.9 C)  TempSrc: Oral Oral  Axillary  SpO2:   90% (!) 82%  Weight:      Height:        Intake/Output Summary (Last 24 hours) at 10/12/2019 1146 Last data filed at 10/12/2019 0522 Gross per 24 hour  Intake -  Output 125 ml  Net -125 ml   Filed Weights   10/13/2019 1946  Weight: 84.1 kg    Examination:  General exam: Appears comfortable.  obtunded.  Not reacting to painful stimuli. Respiratory system: Clear to auscultation. Respiratory effort normal.  On minimal oxygen for comfort. Cardiovascular system: S1 & S2 heard, RRR.  Gastrointestinal system: Abdomen is nondistended, soft and nontender.    Data Reviewed: I have personally reviewed following labs and imaging studies  CBC: Recent Labs  Lab 10/08/2019 1448  10/06/19 0427 10/07/19 0443 10/08/19 0309 10/09/19 0412 10/10/19 0446 10/11/19 0416  WBC 5.9   < > 6.8 7.6 6.8 9.8 8.2 10.4  NEUTROABS 4.7  --  5.9 6.6 5.7  --   --   --   HGB 16.2   < > 15.5 13.3 11.2* 12.3* 11.6* 12.0*  HCT 49.9   < > 47.3 40.4 35.7* 38.0* 39.1 38.6*  MCV 87.1   < > 84.9 86.3 88.6 86.6 93.5 90.2  PLT 141*   < > 149* 167 229 227 194 184   < > = values in this interval not displayed.   Basic Metabolic Panel: Recent Labs  Lab 09/18/2019 1448 10/09/2019 2018 10/06/19 0427 10/07/19 0443 10/08/19 0309  NA  140  --  135 140 142  K 4.2  --  4.2 4.3 4.1  CL 101  --  103 107 110  CO2 26  --  21* 21* 21*  GLUCOSE 116*  --  166* 142* 153*  BUN 18  --  17 32* 43*  CREATININE 0.80 0.86 0.76 0.96 1.05  CALCIUM 8.5*  --  8.3* 8.4* 8.3*   GFR: Estimated Creatinine Clearance: 58.5 mL/min (by C-G formula based on SCr of 1.05 mg/dL). Liver Function Tests: Recent Labs  Lab 10/12/2019 1448 10/06/19 0427 10/07/19 0443 10/08/19 0309  AST 123* 110* 68* 48*  ALT 60* 61* 49* 43  ALKPHOS 66 60 53 46  BILITOT 0.8 1.2 0.6  0.5  PROT 6.7 6.2* 5.7* 5.3*  ALBUMIN 3.4* 3.0* 2.6* 2.5*   No results for input(s): LIPASE, AMYLASE in the last 168 hours. No results for input(s): AMMONIA in the last 168 hours. Coagulation Profile: Recent Labs  Lab 10/03/2019 1500  INR 1.0   Cardiac Enzymes: No results for input(s): CKTOTAL, CKMB, CKMBINDEX, TROPONINI in the last 168 hours. BNP (last 3 results) No results for input(s): PROBNP in the last 8760 hours. HbA1C: No results for input(s): HGBA1C in the last 72 hours. CBG: No results for input(s): GLUCAP in the last 168 hours. Lipid Profile: No results for input(s): CHOL, HDL, LDLCALC, TRIG, CHOLHDL, LDLDIRECT in the last 72 hours. Thyroid Function Tests: No results for input(s): TSH, T4TOTAL, FREET4, T3FREE, THYROIDAB in the last 72 hours. Anemia Panel: No results for input(s): VITAMINB12, FOLATE, FERRITIN, TIBC, IRON, RETICCTPCT in the last 72 hours. Sepsis Labs: Recent Labs  Lab 10/02/2019 1448 10/03/2019 1547 10/12/2019 2018  PROCALCITON <0.10  --  <0.10  LATICACIDVEN  --  1.5 1.9    Recent Results (from the past 240 hour(s))  Blood Culture (routine x 2)     Status: None   Collection Time: 10/11/2019  3:30 PM   Specimen: BLOOD  Result Value Ref Range Status   Specimen Description   Final    BLOOD RIGHT ANTECUBITAL Performed at [facility], [address], [place], [place] [address]    Special Requests   Final    BOTTLES DRAWN AEROBIC AND ANAEROBIC Blood Culture adequate volume Performed at [facility], [address], [place], [place] [address]    Culture   Final    NO GROWTH 5 DAYS Performed at [facility], [address] [place], [place] [address]    Report Status 10/10/2019 FINAL  Final  Urine culture     Status: None   Collection Time: 09/19/2019  7:26 PM   Specimen: Urine, Random  Result Value Ref Range Status   Specimen Description   Final    URINE, RANDOM Performed at [facility],  [address], [place], [place] [address]    Special Requests   Final    NONE Performed at [facility], [address], [place], [place] [address]    Culture   Final    NO GROWTH Performed at [facility], [address] [place], [place] [address]    Report Status 10/07/2019 FINAL  Final  Blood Culture (routine x 2)     Status: None   Collection Time: 10/01/2019  8:18 PM   Specimen: BLOOD LEFT ARM  Result Value Ref Range Status   Specimen Description   Final    BLOOD LEFT ARM Performed at [facility],  [address], [place], [place] [address]    Special Requests   Final    BOTTLES DRAWN AEROBIC AND ANAEROBIC Blood Culture adequate volume Performed at [place] [address], [place], [place] [address]    Culture   Final    NO GROWTH 5 DAYS Performed at [facility], [place] [address] [place], [place] [address]    Report Status 10/10/2019 FINAL  Final         Radiology Studies: No results found.      Scheduled Meds: . glycopyrrolate  0.2 mg Intravenous Q8H   Continuous Infusions: . morphine 4 mg/hr (10/12/19 0523)     LOS: 7 days    Time spent: 25 minutes     Barb Merino, MD [facility] Pager [phone]

## 2019-10-13 DIAGNOSIS — Z515 Encounter for palliative care: Secondary | ICD-10-CM

## 2019-10-13 DIAGNOSIS — J9600 Acute respiratory failure, unspecified whether with hypoxia or hypercapnia: Secondary | ICD-10-CM | POA: Diagnosis not present

## 2019-10-13 DIAGNOSIS — U071 COVID-19: Secondary | ICD-10-CM | POA: Diagnosis not present

## 2019-10-13 NOTE — Progress Notes (Signed)
PMT no charge note  Discussed with bedside RN, patient reported to be actively dying, overall condition unchanged from my examination on 10-12-2019. Medications reviewed, patient remains on Morphine drip.   Appreciate RN assistance in corresponding with the patient's son Tripp. No additional PMT specific interventions or recommendations at this time.   Anticipated hospital death.   No charge note.   Loistine Chance MD [facility] palliative [phone]

## 2019-10-13 NOTE — Progress Notes (Signed)
Pt son Sigismund Fredderick Phenix) Aguilar has been called with a pt update from this RN. All questions regarding the pt were answered to the son's satisfaction. Will continue daily updates.

## 2019-10-13 NOTE — Progress Notes (Signed)
The patient's Morphine gtt bag was replaced with a new bag at 1641 this shift by this RN. 0.4 mL remained in the bag which was wasted with Nonie Hoyer, RN as an additional witness. Pt in no acute distress.

## 2019-10-13 NOTE — Progress Notes (Signed)
PROGRESS NOTE    [name].  TYO:[id number] DOB: Apr 04, 1934 DOA: 10/03/2019 PCP: Patient, No Pcp Per    Brief Narrative:  85 year old gentleman with history of a stroke, paroxysmal A. fib, dementia, current resident of long-term nursing home who was tested positive for Covid last week.  Patient was noted to be more and more lethargic with baseline dementia and sent to ER for further work-up.  In the emergency room repeat COVID-19 positive.  CTA showed bilateral PE with right heart strain.   Assessment & Plan:   Principal Problem:   Acute respiratory failure due to COVID-19 [facility]) Active Problems:   Dementia, multiinfarct, with behavioral disturbance (HCC)   Pulmonary embolism, bilateral (HCC)   Acute metabolic encephalopathy  Acute hypoxic respiratory failure due to COVID-19 pneumonia: Bilateral submassive pulmonary embolism with right heart strain: Acute metabolic encephalopathy in the setting of underlying dementia: Persistently encephalopathic Aspiration, dysphagia in the setting of advanced dementia: End-of-life care:  Patient with severe comorbidities, advanced dementia, persistent encephalopathy. Decision made for comfort care measures. All comfort care medications available. DNR/DNI. Hospice facilities do not have Covid beds available. Morphine infusion with bolus morphine available. Anticipate death in the hospital. RN to pronounce death if occurs in the hospital.   DVT prophylaxis: Comfort care Code Status: Comfort care Family Communication: None, patient son was communicated by palliative care medicine. Disposition Plan: Hospital death expected.     Consultants:   Palliative medicine  Procedures:   None  Antimicrobials:   None   Subjective: Patient seen and examined.  No overnight events.  Deep swallow breathing episodes.  Not in any distress.  Appropriately managed. Objective: Vitals:   10/11/19 0532 10/11/19 0808 10/12/19 0518 10/12/19  2300  BP: 118/79  106/76   Pulse: (!) 158  (!) 157   Resp: 20     Temp: 99.1 F (37.3 C)  100.2 F (37.9 C)   TempSrc: Oral  Axillary   SpO2:  90% (!) 82% (!) 86%  Weight:      Height:        Intake/Output Summary (Last 24 hours) at 10/13/2019 1447 Last data filed at 10/13/2019 1133 Gross per 24 hour  Intake 131.22 ml  Output 450 ml  Net -318.78 ml   Filed Weights   09/19/2019 1946  Weight: 84.1 kg    Examination:  General exam: Appears comfortable.  obtunded.  Not reacting to painful stimuli. Does not look like in any distress. Respiratory system: Clear to auscultation. Respiratory effort normal.  On minimal oxygen for comfort. Cardiovascular system: S1 & S2 heard, RRR.  Gastrointestinal system: Abdomen is nondistended, soft and nontender.    Data Reviewed: I have personally reviewed following labs and imaging studies  CBC: Recent Labs  Lab 10/07/19 0443 10/08/19 0309 10/09/19 0412 10/10/19 0446 10/11/19 0416  WBC 7.6 6.8 9.8 8.2 10.4  NEUTROABS 6.6 5.7  --   --   --   HGB 13.3 11.2* 12.3* 11.6* 12.0*  HCT 40.4 35.7* 38.0* 39.1 38.6*  MCV 86.3 88.6 86.6 93.5 90.2  PLT 167 229 227 194 184   Basic Metabolic Panel: Recent Labs  Lab 10/07/19 0443 10/08/19 0309  NA 140 142  K 4.3 4.1  CL 107 110  CO2 21* 21*  GLUCOSE 142* 153*  BUN 32* 43*  CREATININE 0.96 1.05  CALCIUM 8.4* 8.3*   GFR: Estimated Creatinine Clearance: 58.5 mL/min (by C-G formula based on SCr of 1.05 mg/dL). Liver Function Tests: Recent Labs  Lab  10/07/19 0443 10/08/19 0309  AST 68* 48*  ALT 49* 43  ALKPHOS 53 46  BILITOT 0.6 0.5  PROT 5.7* 5.3*  ALBUMIN 2.6* 2.5*   No results for input(s): LIPASE, AMYLASE in the last 168 hours. No results for input(s): AMMONIA in the last 168 hours. Coagulation Profile: No results for input(s): INR, PROTIME in the last 168 hours. Cardiac Enzymes: No results for input(s): CKTOTAL, CKMB, CKMBINDEX, TROPONINI in the last 168 hours. BNP (last  3 results) No results for input(s): PROBNP in the last 8760 hours. HbA1C: No results for input(s): HGBA1C in the last 72 hours. CBG: No results for input(s): GLUCAP in the last 168 hours. Lipid Profile: No results for input(s): CHOL, HDL, LDLCALC, TRIG, CHOLHDL, LDLDIRECT in the last 72 hours. Thyroid Function Tests: No results for input(s): TSH, T4TOTAL, FREET4, T3FREE, THYROIDAB in the last 72 hours. Anemia Panel: No results for input(s): VITAMINB12, FOLATE, FERRITIN, TIBC, IRON, RETICCTPCT in the last 72 hours. Sepsis Labs: No results for input(s): PROCALCITON, LATICACIDVEN in the last 168 hours.  Recent Results (from the past 240 hour(s))  Blood Culture (routine x 2)     Status: None   Collection Time: 09/30/2019  3:30 PM   Specimen: BLOOD  Result Value Ref Range Status   Specimen Description   Final    BLOOD RIGHT ANTECUBITAL Performed at [facility], [address] [place], [place] [address]    Special Requests   Final    BOTTLES DRAWN AEROBIC AND ANAEROBIC Blood Culture adequate volume Performed at [facility], [address] [place], [place] [address]    Culture   Final    NO GROWTH 5 DAYS Performed at [facility], [address] [place], [place] [address]    Report Status 10/10/2019 FINAL  Final  Urine culture     Status: None   Collection Time: 10/15/2019  7:26 PM   Specimen: Urine, Random  Result Value Ref Range Status   Specimen Description   Final    URINE, RANDOM Performed at [facility], [address] [place], [place] [address]    Special Requests   Final    NONE Performed at [facility], [address] [place], [place] [address]    Culture   Final    NO GROWTH Performed at [facility], [address] [place], [place] [address]    Report Status 10/07/2019 FINAL  Final  Blood Culture (routine x 2)     Status: None   Collection Time: 09/19/2019  8:18 PM    Specimen: BLOOD LEFT ARM  Result Value Ref Range Status   Specimen Description   Final    BLOOD LEFT ARM Performed at [facility], [address] [place], [place] [address]    Special Requests   Final    BOTTLES DRAWN AEROBIC AND ANAEROBIC Blood Culture adequate volume Performed at [facility], [address] [place], [place] [address]    Culture   Final    NO GROWTH 5 DAYS Performed at [facility], [address] [place], [place] [address]    Report Status 10/10/2019 FINAL  Final         Radiology Studies: No results found.      Scheduled Meds: . glycopyrrolate  0.2 mg Intravenous Q8H   Continuous Infusions: . morphine 4 mg/hr (10/12/19 1543)     LOS: 8 days  Time spent:  20 minutes     Barb Merino, MD [facility] Pager [phone]

## 2019-10-17 NOTE — Progress Notes (Signed)
Pt died at 70 was assessed with another nurse Renaldo Fiddler RN. MD Bodenheimer made aware. Family notified.

## 2019-10-17 NOTE — Progress Notes (Signed)
Morphine sulfate 82 ml of 100 ml IV drip wasted in stericycle with nurse Renaldo Fiddler RN

## 2019-10-17 NOTE — Death Summary Note (Signed)
DEATH SUMMARY   Patient Details  Name: Alejandro Salvodward A Dysert Jr. MRN: [id number] DOB: 1934-05-21  Admission/Discharge Information   Admit Date:  08/17/2019  Date of Death: Date of Death: 09/29/2019  Time of Death: Time of Death: 0150  Length of Stay: 9  Referring Physician: Patient, No Pcp Per   Reason(s) for Hospitalization  Altered mental status due to Covid 19 pneumonia and bilateral pulmonary embolism   Diagnoses  Preliminary cause of death: Acute respiratory disease due to COVID-19 virus Secondary Diagnoses (including complications and co-morbidities):  Principal Problem:   Acute respiratory failure due to COVID-19 [facility](HCC) Active Problems:   Dementia, multiinfarct, with behavioral disturbance (HCC)   Pulmonary embolism, bilateral (HCC)   Acute metabolic encephalopathy   Dying care   Brief Hospital Course (including significant findings, care, treatment, and services provided and events leading to death)  Alejandro SalvoEdward A Villafuerte Jr. is a 85 y.o. year old male who has very advanced dementia and debility.  He was admitted to the hospital with altered mental status, hypoxia and found to have COVID-19 pneumonia and bilateral pulmonary embolism.  Patient remained persistently encephalopathic, with no oral intake and no recovery.  Family decided to start on comfort care measures.  Patient was kept in the hospital and provided with end-of-life care with comfort care medications and ultimately died on 1:50 AM, 10/10/2019 in the hospital.    Pertinent Labs and Studies  Significant Diagnostic Studies Ct Head Wo Contrast  Result Date: 08/17/2019 CLINICAL DATA:  Lethargy.  Altered mental status. EXAM: CT HEAD WITHOUT CONTRAST TECHNIQUE: Contiguous axial images were obtained from the base of the skull through the vertex without intravenous contrast. COMPARISON:  Brain CT 07/13/2019 FINDINGS: Brain: Ventricles and sulci are prominent compatible with atrophy. No evidence for acute cortically based  infarct, intracranial hemorrhage, mass lesion or mass-effect. Focal encephalomalacia within the right periventricular white matter. 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 Chronic microvascular ischemic changes. Vascular: Unremarkable. Skull: Intact. Sinuses/Orbits: Paranasal sinuses are well aerated. Mastoid air cells are unremarkable. Other: None. IMPRESSION: No acute intracranial process. Atrophy and chronic microvascular ischemic changes. Electronically Signed   By: Annia Beltrew  Davis M.D.   On: 08/17/2019 18:39   Ct Angio Chest Pe W And/or Wo Contrast  Result Date: 08/17/2019 CLINICAL DATA:  Chest pain. Altered mental status. Coronavirus infection. EXAM: CT ANGIOGRAPHY CHEST WITH CONTRAST TECHNIQUE: Multidetector CT imaging of the chest was performed using the standard protocol during bolus administration of intravenous contrast. Multiplanar CT image reconstructions and MIPs were obtained to evaluate the vascular anatomy. CONTRAST:  100mL OMNIPAQUE IOHEXOL 350 MG/ML SOLN COMPARISON:  Chest radiography same day FINDINGS: Cardiovascular: Pulmonary opacification is excellent. The patient demonstrates extensive bilateral pulmonary emboli including saddle embolus. Heart size is normal but right ventricular to left ventricular ratio is abnormal at 1.2 and therefore suggests submassive disease with right ventricular strain. There is coronary artery calcification. There is ordinary aortic atherosclerosis. Mediastinum/Nodes: No hilar or mediastinal mass or lymphadenopathy. Lungs/Pleura: Bibasilar pulmonary atelectasis. No identifiable pulmonary infiltrate. No mass lesion. Upper Abdomen: Negative Musculoskeletal: Ordinary degenerative changes affect the spine. Review of the MIP images confirms the above findings. IMPRESSION: Positive for acute bilateral PE with CT evidence of right heart strain (RV/LV Ratio = 1.2) consistent with at least submassive (intermediate risk) PE. The  presence of right heart strain has been associated with an increased risk of morbidity and mortality. Please activate Code PE by paging [phone]. Coronary artery calcification. Aortic atherosclerosis. Mild basilar  atelectasis. Critical Value/emergent results were called by telephone at the time of interpretation on 09/18/2019 at 6:43 pm to providerDr.Long, Who verbally acknowledged these results. Electronically Signed   By: Nelson Chimes M.D.   On: 10/02/2019 18:45   Dg Chest Port 1 View  Result Date: 10/07/2019 CLINICAL DATA:  Dyspnea EXAM: PORTABLE CHEST 1 VIEW COMPARISON:  Chest radiograph from one day prior. FINDINGS: Stable cardiomediastinal silhouette with normal heart size. No pneumothorax. No pleural effusion. Minimal right basilar scarring, unchanged. No pulmonary edema. No acute consolidative airspace disease. IMPRESSION: Stable minimal right basilar scarring. Otherwise no active disease in the chest. Electronically Signed   By: Ilona Sorrel M.D.   On: 10/07/2019 19:51   Dg Chest Port 1 View  Result Date: 10/06/2019 CLINICAL DATA:  Tachypnea. Decreased oxygen saturation. COVID-19 positive. Diagnosed with pulmonary embolus yesterday. EXAM: PORTABLE CHEST 1 VIEW COMPARISON:  Radiograph and CT yesterday. FINDINGS: Heart is normal in size with normal mediastinal contours. Minor right lung base atelectasis. Improved left basilar atelectasis from prior radiograph no pulmonary edema, pleural effusion or pneumothorax. No acute osseous abnormalities. IMPRESSION: Improve left and unchanged right lung base atelectasis. No new abnormalities. Electronically Signed   By: Keith Rake M.D.   On: 10/06/2019 06:24   Dg Chest Port 1 View  Result Date: 09/20/2019 CLINICAL DATA:  COVID-19 positive EXAM: PORTABLE CHEST 1 VIEW COMPARISON:  Radiograph July 13, 2019 FINDINGS: Streaky basilar opacities may reflect atelectasis or early infection. No pneumothorax or effusion. Stable cardiomediastinal contours  accounting for portable technique and patient rotation. No acute osseous or soft tissue abnormality. Degenerative changes are present in the imaged spine and shoulders. IMPRESSION: 1. Streaky basilar opacity, may reflect atelectasis or early infection. Electronically Signed   By: Lovena Le M.D.   On: 09/24/2019 14:35   Vas [place] Lower Extremity Venous (dvt)  Result Date: 10/06/2019  Lower Venous Study Indications: Pulmonary embolism.  Risk Factors: COVID 19 positive. Limitations: Patient positioning. Comparison Study: No prior studies. Performing Technologist: Oliver Hum RVT  Examination Guidelines: A complete evaluation includes B-mode imaging, spectral Doppler, color Doppler, and power Doppler as needed of all accessible portions of each vessel. Bilateral testing is considered an integral part of a complete examination. Limited examinations for reoccurring indications may be performed as noted.  +---------+---------------+---------+-----------+----------+--------------+ RIGHT    CompressibilityPhasicitySpontaneityPropertiesThrombus Aging +---------+---------------+---------+-----------+----------+--------------+ CFV      Full           Yes      Yes                                 +---------+---------------+---------+-----------+----------+--------------+ SFJ      Full                                                        +---------+---------------+---------+-----------+----------+--------------+ FV Prox  Full                                                        +---------+---------------+---------+-----------+----------+--------------+ FV Mid   Full                                                        +---------+---------------+---------+-----------+----------+--------------+  FV DistalFull                                                        +---------+---------------+---------+-----------+----------+--------------+ PFV      Full                                                         +---------+---------------+---------+-----------+----------+--------------+ POP      Full           Yes      Yes                                 +---------+---------------+---------+-----------+----------+--------------+ PTV      Full                                                        +---------+---------------+---------+-----------+----------+--------------+ PERO     Full                                                        +---------+---------------+---------+-----------+----------+--------------+   +---------+---------------+---------+-----------+----------+-----------------+ LEFT     CompressibilityPhasicitySpontaneityPropertiesThrombus Aging    +---------+---------------+---------+-----------+----------+-----------------+ CFV      Full           Yes      Yes                                    +---------+---------------+---------+-----------+----------+-----------------+ SFJ      Full                                                           +---------+---------------+---------+-----------+----------+-----------------+ FV Prox  Partial        Yes      Yes                  Age Indeterminate +---------+---------------+---------+-----------+----------+-----------------+ FV Mid   None           No       No                   Acute             +---------+---------------+---------+-----------+----------+-----------------+ FV DistalNone           No       No                   Acute             +---------+---------------+---------+-----------+----------+-----------------+ PFV      Full                                                           +---------+---------------+---------+-----------+----------+-----------------+  POP      None           No       No                   Acute             +---------+---------------+---------+-----------+----------+-----------------+ PTV      Full                                                            +---------+---------------+---------+-----------+----------+-----------------+ PERO     Full                                                           +---------+---------------+---------+-----------+----------+-----------------+     Summary: Right: There is no evidence of deep vein thrombosis in the lower extremity. However, portions of this examination were limited- see technologist comments above. No cystic structure found in the popliteal fossa. Left: Findings consistent with acute deep vein thrombosis involving the left femoral vein, and left popliteal vein. No cystic structure found in the popliteal fossa.  *See table(s) above for measurements and observations. Electronically signed by Lemar Livings MD on 10/06/2019 at 5:45:34 PM.    Final     Microbiology Recent Results (from the past 240 hour(s))  Blood Culture (routine x 2)     Status: None   Collection Time: 2019/10/24  3:30 PM   Specimen: BLOOD  Result Value Ref Range Status   Specimen Description   Final    BLOOD RIGHT ANTECUBITAL Performed at [facility], [address], [place], [place] [address]    Special Requests   Final    BOTTLES DRAWN AEROBIC AND ANAEROBIC Blood Culture adequate volume Performed at [facility], [address] [place], [place] [address]    Culture   Final    NO GROWTH 5 DAYS Performed at [facility], [address] [place], [place] [address]    Report Status 10/10/2019 FINAL  Final  Urine culture     Status: None   Collection Time: 10-24-19  7:26 PM   Specimen: Urine, Random  Result Value Ref Range Status   Specimen Description   Final    URINE, RANDOM Performed at [facility], [address], [place], [place] [address]    Special Requests   Final    NONE Performed at [facility], [address], [place], [place] [address]    Culture   Final    NO GROWTH Performed  at [facility], [address] [place], [place] [address]    Report Status 10/07/2019 FINAL  Final  Blood Culture (routine x 2)     Status: None   Collection Time: 2019-10-24  8:18 PM   Specimen: BLOOD LEFT ARM  Result Value Ref Range Status   Specimen Description   Final    BLOOD LEFT ARM Performed at [facility], [address] [place], [place] [address]    Special Requests   Final    BOTTLES DRAWN AEROBIC AND ANAEROBIC Blood Culture  adequate volume Performed at [facility], [address], [place], [place] [address]    Culture   Final    NO GROWTH 5 DAYS Performed at [facility], [address], [place], [place] [address]    Report Status 10/10/2019 FINAL  Final    Lab Basic Metabolic Panel: Recent Labs  Lab 10/08/19 0309  NA 142  K 4.1  CL 110  CO2 21*  GLUCOSE 153*  BUN 43*  CREATININE 1.05  CALCIUM 8.3*   Liver Function Tests: Recent Labs  Lab 10/08/19 0309  AST 48*  ALT 43  ALKPHOS 46  BILITOT 0.5  PROT 5.3*  ALBUMIN 2.5*   No results for input(s): LIPASE, AMYLASE in the last 168 hours. No results for input(s): AMMONIA in the last 168 hours. CBC: Recent Labs  Lab 10/08/19 0309 10/09/19 0412 10/10/19 0446 10/11/19 0416  WBC 6.8 9.8 8.2 10.4  NEUTROABS 5.7  --   --   --   HGB 11.2* 12.3* 11.6* 12.0*  HCT 35.7* 38.0* 39.1 38.6*  MCV 88.6 86.6 93.5 90.2  PLT 229 227 194 184   Cardiac Enzymes: No results for input(s): CKTOTAL, CKMB, CKMBINDEX, TROPONINI in the last 168 hours. Sepsis Labs: Recent Labs  Lab 10/08/19 0309 10/09/19 0412 10/10/19 0446 10/11/19 0416  WBC 6.8 9.8 8.2 10.4    Procedures/Operations  None   Mikailah Morel 2019/10/30, 3:47 PM

## 2019-10-17 DEATH — deceased
